# Patient Record
Sex: Female | Born: 1960
Health system: Southern US, Community
[De-identification: ages and names within clinical notes are randomized; demographics above are authoritative.]

## PROBLEM LIST (undated history)

## (undated) DIAGNOSIS — K219 Gastro-esophageal reflux disease without esophagitis: Secondary | ICD-10-CM

## (undated) DIAGNOSIS — M25511 Pain in right shoulder: Secondary | ICD-10-CM

## (undated) DIAGNOSIS — E785 Hyperlipidemia, unspecified: Secondary | ICD-10-CM

## (undated) DIAGNOSIS — I10 Essential (primary) hypertension: Secondary | ICD-10-CM

## (undated) DIAGNOSIS — F329 Major depressive disorder, single episode, unspecified: Secondary | ICD-10-CM

## (undated) DIAGNOSIS — I2510 Atherosclerotic heart disease of native coronary artery without angina pectoris: Secondary | ICD-10-CM

## (undated) DIAGNOSIS — N3000 Acute cystitis without hematuria: Secondary | ICD-10-CM

## (undated) DIAGNOSIS — G8929 Other chronic pain: Secondary | ICD-10-CM

## (undated) DIAGNOSIS — F32A Depression, unspecified: Secondary | ICD-10-CM

## (undated) DIAGNOSIS — R918 Other nonspecific abnormal finding of lung field: Secondary | ICD-10-CM

## (undated) HISTORY — DX: Other chronic pain: G89.29

## (undated) HISTORY — DX: Acute cystitis without hematuria: N30.00

## (undated) HISTORY — DX: Pain in right shoulder: M25.511

## (undated) HISTORY — DX: Hyperlipidemia, unspecified: E78.5

## (undated) HISTORY — DX: Atherosclerotic heart disease of native coronary artery without angina pectoris: I25.10

## (undated) HISTORY — PX: GASTRIC FUNDOPLICATION: SHX226

## (undated) HISTORY — DX: Other nonspecific abnormal finding of lung field: R91.8

## (undated) HISTORY — DX: Gastro-esophageal reflux disease without esophagitis: K21.9

---

## 2010-06-22 ENCOUNTER — Emergency Department: Payer: Self-pay | Admitting: Emergency Medicine

## 2010-06-24 ENCOUNTER — Ambulatory Visit: Payer: Self-pay | Admitting: Surgery

## 2010-06-25 ENCOUNTER — Emergency Department: Payer: Self-pay | Admitting: Emergency Medicine

## 2010-08-26 ENCOUNTER — Emergency Department: Payer: Self-pay | Admitting: Emergency Medicine

## 2010-09-27 ENCOUNTER — Ambulatory Visit: Payer: Self-pay | Admitting: Family Medicine

## 2010-10-30 HISTORY — PX: TOTAL VAGINAL HYSTERECTOMY: SHX2548

## 2011-03-12 ENCOUNTER — Inpatient Hospital Stay: Payer: Self-pay | Admitting: Internal Medicine

## 2011-07-05 ENCOUNTER — Emergency Department: Payer: Self-pay | Admitting: Unknown Physician Specialty

## 2011-08-31 DIAGNOSIS — R918 Other nonspecific abnormal finding of lung field: Secondary | ICD-10-CM | POA: Insufficient documentation

## 2012-02-07 ENCOUNTER — Ambulatory Visit: Payer: Self-pay

## 2012-05-24 DIAGNOSIS — K219 Gastro-esophageal reflux disease without esophagitis: Secondary | ICD-10-CM | POA: Insufficient documentation

## 2012-05-31 DIAGNOSIS — L52 Erythema nodosum: Secondary | ICD-10-CM | POA: Insufficient documentation

## 2012-10-30 LAB — HM MAMMOGRAPHY: HM MAMMO: NORMAL

## 2013-10-30 HISTORY — PX: COLONOSCOPY: SHX5424

## 2013-10-30 HISTORY — PX: COLONOSCOPY WITH ESOPHAGOGASTRODUODENOSCOPY (EGD): SHX5779

## 2013-10-30 HISTORY — PX: HIATAL HERNIA REPAIR: SHX195

## 2013-10-30 LAB — HM COLONOSCOPY: HM Colonoscopy: NORMAL

## 2014-12-19 ENCOUNTER — Ambulatory Visit: Payer: Self-pay | Admitting: Emergency Medicine

## 2015-02-02 ENCOUNTER — Ambulatory Visit
Admit: 2015-02-02 | Disposition: A | Discharge status: Home / Self Care | Payer: Self-pay | Attending: Family Medicine | Admitting: Family Medicine

## 2015-02-04 ENCOUNTER — Ambulatory Visit
Admit: 2015-02-04 | Disposition: A | Discharge status: Home / Self Care | Payer: Self-pay | Attending: Unknown Physician Specialty | Admitting: Unknown Physician Specialty

## 2015-02-17 ENCOUNTER — Encounter: Payer: Self-pay | Admitting: Internal Medicine

## 2015-02-17 DIAGNOSIS — N951 Menopausal and female climacteric states: Secondary | ICD-10-CM | POA: Insufficient documentation

## 2015-02-17 DIAGNOSIS — I10 Essential (primary) hypertension: Secondary | ICD-10-CM | POA: Insufficient documentation

## 2015-02-17 DIAGNOSIS — F17201 Nicotine dependence, unspecified, in remission: Secondary | ICD-10-CM | POA: Insufficient documentation

## 2015-02-17 DIAGNOSIS — Z9889 Other specified postprocedural states: Secondary | ICD-10-CM | POA: Insufficient documentation

## 2015-02-17 DIAGNOSIS — M542 Cervicalgia: Secondary | ICD-10-CM | POA: Insufficient documentation

## 2015-02-17 DIAGNOSIS — E782 Mixed hyperlipidemia: Secondary | ICD-10-CM | POA: Insufficient documentation

## 2015-02-17 DIAGNOSIS — E781 Pure hyperglyceridemia: Secondary | ICD-10-CM | POA: Insufficient documentation

## 2015-02-25 ENCOUNTER — Other Ambulatory Visit: Payer: Self-pay

## 2015-02-25 DIAGNOSIS — Z1231 Encounter for screening mammogram for malignant neoplasm of breast: Secondary | ICD-10-CM

## 2015-03-17 ENCOUNTER — Ambulatory Visit
Admission: RE | Admit: 2015-03-17 | Discharge: 2015-03-17 | Disposition: A | Discharge status: Home / Self Care | Payer: Managed Care, Other (non HMO) | Source: Ambulatory Visit | Attending: Internal Medicine | Admitting: Internal Medicine

## 2015-03-17 DIAGNOSIS — Z1231 Encounter for screening mammogram for malignant neoplasm of breast: Secondary | ICD-10-CM | POA: Diagnosis not present

## 2015-04-09 ENCOUNTER — Ambulatory Visit
Admission: EM | Admit: 2015-04-09 | Discharge: 2015-04-09 | Disposition: A | Discharge status: Home / Self Care | Payer: Managed Care, Other (non HMO) | Attending: Family Medicine | Admitting: Family Medicine

## 2015-04-09 DIAGNOSIS — R109 Unspecified abdominal pain: Secondary | ICD-10-CM | POA: Diagnosis present

## 2015-04-09 DIAGNOSIS — G8929 Other chronic pain: Secondary | ICD-10-CM

## 2015-04-09 DIAGNOSIS — I10 Essential (primary) hypertension: Secondary | ICD-10-CM | POA: Insufficient documentation

## 2015-04-09 DIAGNOSIS — N390 Urinary tract infection, site not specified: Secondary | ICD-10-CM | POA: Insufficient documentation

## 2015-04-09 DIAGNOSIS — R1011 Right upper quadrant pain: Secondary | ICD-10-CM | POA: Diagnosis not present

## 2015-04-09 DIAGNOSIS — R1010 Upper abdominal pain, unspecified: Secondary | ICD-10-CM | POA: Diagnosis not present

## 2015-04-09 HISTORY — DX: Major depressive disorder, single episode, unspecified: F32.9

## 2015-04-09 HISTORY — DX: Depression, unspecified: F32.A

## 2015-04-09 HISTORY — DX: Essential (primary) hypertension: I10

## 2015-04-09 LAB — CBC WITH DIFFERENTIAL/PLATELET
Basophils Absolute: 0.1 10*3/uL (ref 0–0.1)
Basophils Relative: 1 %
Eosinophils Absolute: 0.1 10*3/uL (ref 0–0.7)
Eosinophils Relative: 1 %
HCT: 39.1 % (ref 35.0–47.0)
Hemoglobin: 13.5 g/dL (ref 12.0–16.0)
Lymphocytes Relative: 19 %
Lymphs Abs: 1.9 10*3/uL (ref 1.0–3.6)
MCH: 33.4 pg (ref 26.0–34.0)
MCHC: 34.6 g/dL (ref 32.0–36.0)
MCV: 96.5 fL (ref 80.0–100.0)
Monocytes Absolute: 0.9 10*3/uL (ref 0.2–0.9)
Monocytes Relative: 9 %
Neutro Abs: 7.1 10*3/uL — ABNORMAL HIGH (ref 1.4–6.5)
Neutrophils Relative %: 70 %
Platelets: 214 10*3/uL (ref 150–440)
RBC: 4.05 MIL/uL (ref 3.80–5.20)
RDW: 12.7 % (ref 11.5–14.5)
WBC: 10.2 10*3/uL (ref 3.6–11.0)

## 2015-04-09 LAB — URINALYSIS COMPLETE WITH MICROSCOPIC (ARMC ONLY)
Bilirubin Urine: NEGATIVE
Glucose, UA: NEGATIVE mg/dL
Ketones, ur: NEGATIVE mg/dL
NITRITE: NEGATIVE
Protein, ur: NEGATIVE mg/dL
SPECIFIC GRAVITY, URINE: 1.025 (ref 1.005–1.030)
pH: 5 (ref 5.0–8.0)

## 2015-04-09 LAB — COMPREHENSIVE METABOLIC PANEL
ALT: 38 U/L (ref 14–54)
AST: 29 U/L (ref 15–41)
Albumin: 4.3 g/dL (ref 3.5–5.0)
Alkaline Phosphatase: 101 U/L (ref 38–126)
Anion gap: 7 (ref 5–15)
BUN: 20 mg/dL (ref 6–20)
CO2: 31 mmol/L (ref 22–32)
Calcium: 9.6 mg/dL (ref 8.9–10.3)
Chloride: 103 mmol/L (ref 101–111)
Creatinine, Ser: 0.82 mg/dL (ref 0.44–1.00)
GFR calc Af Amer: >60 mL/min (ref >60)
GFR calc non Af Amer: >60 mL/min (ref >60)
Glucose, Bld: 116 mg/dL — ABNORMAL HIGH (ref 65–99)
Potassium: 3.6 mmol/L (ref 3.5–5.1)
Sodium: 141 mmol/L (ref 135–145)
Total Bilirubin: 0.5 mg/dL (ref 0.3–1.2)
Total Protein: 7.6 g/dL (ref 6.5–8.1)

## 2015-04-09 LAB — LIPASE, BLOOD: Lipase: 46 U/L (ref 22–51)

## 2015-04-09 LAB — AMYLASE: Amylase: 72 U/L (ref 28–100)

## 2015-04-09 MED ORDER — RANITIDINE HCL 150 MG PO CAPS: 150.0000 mg | ORAL_CAPSULE | Freq: Every day | ORAL | Status: DC

## 2015-04-09 MED ORDER — SULFAMETHOXAZOLE-TRIMETHOPRIM 800-160 MG PO TABS: 1.0000 | ORAL_TABLET | Freq: Two times a day (BID) | ORAL | Status: DC

## 2015-04-09 MED ORDER — PHENAZOPYRIDINE HCL 200 MG PO TABS: 200.0000 mg | ORAL_TABLET | Freq: Three times a day (TID) | ORAL | Status: DC

## 2015-04-09 NOTE — ED Provider Notes (Signed)
CSN: [id number]     Arrival date & time 04/09/15  4098 History   None    Chief Complaint  Patient presents with  . Flank Pain   (Consider location/radiation/quality/duration/timing/severity/associated sxs/prior Treatment) Patient is a 54 y.o. female presenting with abdominal pain. The history is provided by the patient. No language interpreter was used.  Abdominal Pain Pain location:  RUQ Pain quality: aching and cramping   Pain radiates to:  Back Pain severity:  Moderate (varies) Onset quality:  Sudden Timing:  Intermittent Progression:  Waxing and waning Chronicity:  Recurrent Context comment:  Pt c/o RUQ pain radiating to right back x 4 years, states it happens right afte eating. Previous surgery and endoscopy For GI issues in [place]. Has PCP appt scheduled for next week. Denies ETOH or smoking. Requesting referral to local GI Relieved by:  Nothing Worsened by:  Eating Ineffective treatments:  Antacids Associated symptoms: nausea   Associated symptoms: no chest pain, no chills, no constipation, no diarrhea, no dysuria, no fatigue, no fever, no hematemesis, no hematochezia, no hematuria, no melena, no shortness of breath, no vaginal bleeding, no vaginal discharge and no vomiting   Risk factors: multiple surgeries     Past Medical History  Diagnosis Date  . Hypertension   . Depression    Past Surgical History  Procedure Laterality Date  . Hiatal hernia repair  2015  . Total vaginal hysterectomy  2012  . Abdominal hysterectomy     Family History  Problem Relation Age of Onset  . Diabetes Mother   . Heart disease Mother   . Diabetes Sister    History  Substance Use Topics  . Smoking status: Former Games developer  . Smokeless tobacco: Not on file  . Alcohol Use: No   OB History    No data available     Review of Systems  Constitutional: Positive for appetite change. Negative for fever, chills and fatigue.  HENT: Negative.   Respiratory: Negative for shortness of breath.    Cardiovascular: Negative for chest pain.  Gastrointestinal: Positive for nausea and abdominal pain. Negative for vomiting, diarrhea, constipation, melena, hematochezia and hematemesis.  Endocrine: Negative.   Genitourinary: Negative for dysuria, hematuria, vaginal bleeding and vaginal discharge.  Skin: Negative for rash.  Allergic/Immunologic: Negative.   Neurological: Negative.   Hematological: Negative.   Psychiatric/Behavioral: Negative.   All other systems reviewed and are negative.   Allergies  Review of patient's allergies indicates no known allergies.  Home Medications   Prior to Admission medications   Medication Sig Start Date End Date Taking? Authorizing Provider  aspirin 81 MG tablet Take 1 tablet by mouth daily.   Yes Historical Provider, MD  losartan (COZAAR) 50 MG tablet Take 1 tablet by mouth daily.   Yes Historical Provider, MD  metoprolol (LOPRESSOR) 50 MG tablet Take 1 tablet by mouth daily.   Yes Historical Provider, MD  PARoxetine (PAXIL) 20 MG tablet Take 1 tablet by mouth daily.   Yes Historical Provider, MD  phenazopyridine (PYRIDIUM) 200 MG tablet Take 1 tablet (200 mg total) by mouth 3 (three) times daily. 04/09/15   Clancy Gourd, NP  Probiotic CAPS Take by mouth.    Historical Provider, MD  ranitidine (ZANTAC) 150 MG capsule Take 1 capsule (150 mg total) by mouth daily. 04/09/15   Clancy Gourd, NP  simvastatin (ZOCOR) 20 MG tablet Take 40 mg by mouth daily.     Historical Provider, MD  sulfamethoxazole-trimethoprim (BACTRIM DS,SEPTRA DS) 800-160 MG per tablet Take 1  tablet by mouth 2 (two) times daily. 04/09/15   Rosa Gambale, NP   BP 149/88 mmHg  Pulse 68  Temp(Src) 97.4 F (36.3 C) (Tympanic)  Resp 16  Ht 5\' 3"  (1.6 m)  Wt 140 lb (63.504 kg)  BMI 24.81 kg/m2  SpO2 97% Physical Exam  Constitutional: She is oriented to person, place, and time. She appears well-developed and well-nourished. She is active and cooperative.  Non-toxic  appearance. She does not have a sickly appearance. She does not appear ill. No distress.  HENT:  Head: Normocephalic.  Right Ear: Tympanic membrane normal.  Left Ear: Tympanic membrane normal.  Nose: Nose normal.  Mouth/Throat: Uvula is midline, oropharynx is clear and moist and mucous membranes are normal.  Neck: Trachea normal.  Cardiovascular: Normal rate, regular rhythm, normal heart sounds and normal pulses.   Pulmonary/Chest: Effort normal and breath sounds normal.  Abdominal: Soft. Normal appearance and bowel sounds are normal. She exhibits no distension. There is tenderness in the right upper quadrant. There is no rebound and no guarding.  Neurological: She is alert and oriented to person, place, and time. No cranial nerve deficit or sensory deficit. GCS eye subscore is 4. GCS verbal subscore is 5. GCS motor subscore is 6.  Psychiatric: She has a normal mood and affect. Her speech is normal.  Nursing note and vitals reviewed.   ED Course  Procedures (including critical care time) Labs Review Labs Reviewed  URINALYSIS COMPLETEWITH MICROSCOPIC ([facility] ONLY) - Abnormal; Notable for the following:    Hgb urine dipstick TRACE (*)    Leukocytes, UA 1+ (*)    Bacteria, UA MANY (*)    Squamous Epithelial / LPF 6-30 (*)    All other components within normal limits  CBC WITH DIFFERENTIAL/PLATELET - Abnormal; Notable for the following:    Neutro Abs 7.1 (*)    All other components within normal limits  COMPREHENSIVE METABOLIC PANEL - Abnormal; Notable for the following:    Glucose, Bld 116 (*)    All other components within normal limits  URINE CULTURE  AMYLASE  LIPASE, BLOOD   Results for orders placed or performed during the hospital encounter of 04/09/15  Urinalysis complete, with microscopic  Result Value Ref Range   Color, Urine YELLOW YELLOW   APPearance CLEAR CLEAR   Glucose, UA NEGATIVE NEGATIVE mg/dL   Bilirubin Urine NEGATIVE NEGATIVE   Ketones, ur NEGATIVE NEGATIVE  mg/dL   Specific Gravity, Urine 1.025 1.005 - 1.030   Hgb urine dipstick TRACE (A) NEGATIVE   pH 5.0 5.0 - 8.0   Protein, ur NEGATIVE NEGATIVE mg/dL   Nitrite NEGATIVE NEGATIVE   Leukocytes, UA 1+ (A) NEGATIVE   RBC / HPF 0-5 <3 RBC/hpf   WBC, UA 6-30 <3 WBC/hpf   Bacteria, UA MANY (A) RARE   Squamous Epithelial / LPF 6-30 (A) RARE  CBC with Differential  Result Value Ref Range   WBC 10.2 3.6 - 11.0 K/uL   RBC 4.05 3.80 - 5.20 MIL/uL   Hemoglobin 13.5 12.0 - 16.0 g/dL   HCT 27.0 78.6 - 75.4 %   MCV 96.5 80.0 - 100.0 fL   MCH 33.4 26.0 - 34.0 pg   MCHC 34.6 32.0 - 36.0 g/dL   RDW 49.2 01.0 - 07.1 %   Platelets 214 150 - 440 K/uL   Neutrophils Relative % 70 %   Neutro Abs 7.1 (H) 1.4 - 6.5 K/uL   Lymphocytes Relative 19 %   Lymphs Abs 1.9 1.0 -  3.6 K/uL   Monocytes Relative 9 %   Monocytes Absolute 0.9 0.2 - 0.9 K/uL   Eosinophils Relative 1 %   Eosinophils Absolute 0.1 0 - 0.7 K/uL   Basophils Relative 1 %   Basophils Absolute 0.1 0 - 0.1 K/uL  Comprehensive metabolic panel  Result Value Ref Range   Sodium 141 135 - 145 mmol/L   Potassium 3.6 3.5 - 5.1 mmol/L   Chloride 103 101 - 111 mmol/L   CO2 31 22 - 32 mmol/L   Glucose, Bld 116 (H) 65 - 99 mg/dL   BUN 20 6 - 20 mg/dL   Creatinine, Ser 1.61 0.44 - 1.00 mg/dL   Calcium 9.6 8.9 - 09.6 mg/dL   Total Protein 7.6 6.5 - 8.1 g/dL   Albumin 4.3 3.5 - 5.0 g/dL   AST 29 15 - 41 U/L   ALT 38 14 - 54 U/L   Alkaline Phosphatase 101 38 - 126 U/L   Total Bilirubin 0.5 0.3 - 1.2 mg/dL   GFR calc non Af Amer >60 >60 mL/min   GFR calc Af Amer >60 >60 mL/min   Anion gap 7 5 - 15  Amylase  Result Value Ref Range   Amylase 72 28 - 100 U/L  Lipase, blood  Result Value Ref Range   Lipase 46 22 - 51 U/L   Imaging Review No results found.   MDM   1. Chronic RUQ pain   2. UTI (lower urinary tract infection)   3. Essential hypertension     0920: Labs pending.   Discusssed results and plan of care with pt. Referred pt  back to PCP, Dr. Asencion Partridge for general medical issues. Referred back to GI in [facility] or local GI, Dr. Bluford Kaufmann, for further evaluation. Treating with bactrim for UTI and Zantac for abdominal pain/stomach issues. Pt verbalized understanding to this provider.   Clancy Gourd, NP 04/09/15 1229

## 2015-04-09 NOTE — Discharge Instructions (Signed)
Abdominal Pain Many things can cause belly (abdominal) pain. Most times, the belly pain is not dangerous. Many cases of belly pain can be watched and treated at home. HOME CARE   Do not take medicines that help you go poop (laxatives) unless told to by your doctor.  Only take medicine as told by your doctor.  Eat or drink as told by your doctor. Your doctor will tell you if you should be on a special diet. GET HELP IF:  You do not know what is causing your belly pain.  You have belly pain while you are sick to your stomach (nauseous) or have runny poop (diarrhea).  You have pain while you pee or poop.  Your belly pain wakes you up at night.  You have belly pain that gets worse or better when you eat.  You have belly pain that gets worse when you eat fatty foods.  You have a fever. GET HELP RIGHT AWAY IF:   The pain does not go away within 2 hours.  You keep throwing up (vomiting).  The pain changes and is only in the right or left part of the belly.  You have bloody or tarry looking poop. MAKE SURE YOU:   Understand these instructions.  Will watch your condition.  Will get help right away if you are not doing well or get worse. Document Released: 04/03/2008 Document Revised: 10/21/2013 Document Reviewed: 06/25/2013 [facility] Patient Information 2015 [place], [place]. This information is not intended to replace advice given to you by your health care provider. Make sure you discuss any questions you have with your health care provider.  Please avoid spicy,greasy fried foods as they will aggravate your symptoms,avoid alcohol and caffeine as well. Follow up with your PCP Dr. Asencion Partridge as scheduled. Folllow up with Dr. Bluford Kaufmann, GI, for further evalaution of GI symptoms or f/u with your provider in [place]. Take meds as directed(Zantac, Bactrim), drink plenty of water. Go to ER for urgent, emergent, or worsening issues(CP, SOB,etc). Return to [facility] as needed.

## 2015-04-09 NOTE — ED Notes (Signed)
Pt states "I have right side and back pain, especially after I eat. I have watery stools after I eat. This has been going for years."

## 2015-04-11 LAB — URINE CULTURE: Culture: >=100000

## 2015-04-15 ENCOUNTER — Telehealth: Payer: Self-pay | Admitting: Surgery

## 2015-04-15 NOTE — Telephone Encounter (Signed)
LVM for pt to call and sched. appt for pain/swelling under arm pit

## 2015-04-18 ENCOUNTER — Encounter: Payer: Self-pay | Admitting: Internal Medicine

## 2015-04-18 ENCOUNTER — Other Ambulatory Visit: Payer: Self-pay | Admitting: Internal Medicine

## 2015-04-19 ENCOUNTER — Encounter: Payer: Self-pay | Admitting: Internal Medicine

## 2015-04-19 ENCOUNTER — Other Ambulatory Visit: Payer: Self-pay

## 2015-04-19 ENCOUNTER — Ambulatory Visit (INDEPENDENT_AMBULATORY_CARE_PROVIDER_SITE_OTHER): Payer: Managed Care, Other (non HMO) | Admitting: Internal Medicine

## 2015-04-19 VITALS — BP 132/82 | HR 60 | Ht 62.5 in | Wt 134.8 lb

## 2015-04-19 DIAGNOSIS — I10 Essential (primary) hypertension: Secondary | ICD-10-CM

## 2015-04-19 DIAGNOSIS — N3001 Acute cystitis with hematuria: Secondary | ICD-10-CM | POA: Diagnosis not present

## 2015-04-19 DIAGNOSIS — G8929 Other chronic pain: Secondary | ICD-10-CM | POA: Insufficient documentation

## 2015-04-19 DIAGNOSIS — M25511 Pain in right shoulder: Secondary | ICD-10-CM | POA: Diagnosis not present

## 2015-04-19 DIAGNOSIS — Z0000 Encounter for general adult medical examination without abnormal findings: Secondary | ICD-10-CM | POA: Diagnosis not present

## 2015-04-19 DIAGNOSIS — K219 Gastro-esophageal reflux disease without esophagitis: Secondary | ICD-10-CM

## 2015-04-19 DIAGNOSIS — E785 Hyperlipidemia, unspecified: Secondary | ICD-10-CM

## 2015-04-19 LAB — POCT URINALYSIS DIPSTICK
Bilirubin, UA: NEGATIVE
Glucose, UA: NEGATIVE
KETONES UA: NEGATIVE
NITRITE UA: POSITIVE
Spec Grav, UA: >=1.03
Urobilinogen, UA: 0.2
pH, UA: 5

## 2015-04-19 MED ORDER — SIMVASTATIN 40 MG PO TABS: 40.0000 mg | ORAL_TABLET | Freq: Every day | ORAL | Status: DC

## 2015-04-19 MED ORDER — PAROXETINE HCL 20 MG PO TABS: 20.0000 mg | ORAL_TABLET | Freq: Every day | ORAL | Status: DC

## 2015-04-19 MED ORDER — DICYCLOMINE HCL 10 MG PO CAPS: 10.0000 mg | ORAL_CAPSULE | Freq: Three times a day (TID) | ORAL | Status: DC

## 2015-04-19 MED ORDER — OMEPRAZOLE 20 MG PO CPDR: 20.0000 mg | DELAYED_RELEASE_CAPSULE | Freq: Every day | ORAL | Status: DC

## 2015-04-19 MED ORDER — METOPROLOL TARTRATE 50 MG PO TABS: 50.0000 mg | ORAL_TABLET | Freq: Two times a day (BID) | ORAL | Status: DC

## 2015-04-19 MED ORDER — CIPROFLOXACIN HCL 250 MG PO TABS: 250.0000 mg | ORAL_TABLET | Freq: Two times a day (BID) | ORAL | Status: DC

## 2015-04-19 MED ORDER — LOSARTAN POTASSIUM 50 MG PO TABS: 50.0000 mg | ORAL_TABLET | Freq: Every day | ORAL | Status: DC

## 2015-04-19 NOTE — Progress Notes (Signed)
Date:  04/19/2015   Name:  Zillah Alexie   DOB:  [birth year]   MRN:  [id number]   Chief Complaint: Annual Exam Darlene Newman is a 54 y.o. female who presents today for her Complete Annual Exam. She feels fairly well. She reports exercising none.  She is dealing with shoulder and neck pain. She reports she is sleeping well. She has no breast problems.  Recent mammogram was normal.  CLINICAL DATA: Screening.  EXAM: DIGITAL SCREENING BILATERAL MAMMOGRAM WITH CAD  COMPARISON: Previous exam(s).  ACR Breast Density Category b: There are scattered areas of fibroglandular density.  FINDINGS: There are no findings suspicious for malignancy. Images were processed with CAD.  IMPRESSION: No mammographic evidence of malignancy. A result letter of this screening mammogram will be mailed directly to the patient.  RECOMMENDATION: Screening mammogram in one year. (Code:[id number])  BI-RADS CATEGORY 1: Negative.   Electronically Signed  By: Sherian Rein M.D.  On: 03/17/2015 10:09  Hypertension Associated symptoms include neck pain. Pertinent negatives include no chest pain, headaches, palpitations or shortness of breath. Past treatments include angiotensin blockers and beta blockers. The current treatment provides moderate improvement. There are no compliance problems.  There is no history of kidney disease or CAD/MI. There is no history of chronic renal disease.  Hyperlipidemia This is a chronic problem. The problem is controlled. Recent lipid tests were reviewed and are normal. She has no history of chronic renal disease. Pertinent negatives include no chest pain or shortness of breath. Current antihyperlipidemic treatment includes statins. The current treatment provides significant improvement of lipids. There are no compliance problems.   Abdominal Pain This is a recurrent problem. The current episode started more than 1 year ago. The problem occurs intermittently (about 2  weeks out of every 8 weeks has symptoms). The most recent episode lasted 2 weeks. The problem has been waxing and waning. The pain is mild. The quality of the pain is colicky and cramping. Associated symptoms include arthralgias, diarrhea and flatus. Pertinent negatives include no dysuria, headaches, hematuria, vomiting or weight loss. The pain is aggravated by eating. The pain is relieved by bowel movements. Prior diagnostic workup includes GI consult, lower endoscopy, upper endoscopy and ultrasound (symptoms have been attributed to gall bladder disease but she states the she has had several ultrasound and a HIDDA scan - all negative).     Review of Systems:  Review of Systems  Constitutional: Negative for weight loss and unexpected weight change.  HENT: Negative for hearing loss.   Eyes: Negative for visual disturbance.  Respiratory: Positive for cough. Negative for shortness of breath and wheezing.   Cardiovascular: Negative for chest pain, palpitations and leg swelling.  Gastrointestinal: Positive for diarrhea, abdominal distention and flatus. Negative for vomiting and abdominal pain.  Genitourinary: Negative for dysuria, urgency and hematuria.  Musculoskeletal: Positive for back pain, arthralgias and neck pain.  Skin: Negative for color change and rash.  Neurological: Negative for dizziness and headaches.  Hematological: Negative for adenopathy.  Psychiatric/Behavioral: Negative for sleep disturbance and dysphoric mood.    Patient Active Problem List   Diagnosis Date Noted  . HLD (hyperlipidemia) 02/17/2015  . Benign hypertension 02/17/2015  . Climacteric 02/17/2015  . Cervical pain 02/17/2015  . History of fundoplication 02/17/2015  . Current tobacco use 02/17/2015  . Calcification of coronary artery 12/25/2012  . Dermatitis contusiformis 05/31/2012  . Acid reflux 05/24/2012  . Lung mass 08/31/2011    Prior to Admission medications   Medication Sig Start Date  End Date  Taking? Authorizing Provider  losartan (COZAAR) 50 MG tablet Take 1 tablet by mouth daily.   Yes Historical Provider, MD  metoprolol (LOPRESSOR) 50 MG tablet Take 1 tablet by mouth daily.   Yes Historical Provider, MD  PARoxetine (PAXIL) 20 MG tablet Take 1 tablet by mouth daily.   Yes Historical Provider, MD  aspirin 81 MG tablet Take 1 tablet by mouth daily.    Historical Provider, MD  meloxicam (MOBIC) 15 MG tablet Take 1 tablet by mouth daily. 01/29/15   Historical Provider, MD  phenazopyridine (PYRIDIUM) 200 MG tablet Take 1 tablet (200 mg total) by mouth 3 (three) times daily. Patient not taking: Reported on 04/19/2015 04/09/15   Clancy Gourd, NP  Probiotic CAPS Take by mouth.    Historical Provider, MD  PROVENTIL HFA 108 (90 BASE) MCG/ACT inhaler Inhale 2 puffs into the lungs 4 (four) times daily as needed. 02/02/15   Historical Provider, MD  ranitidine (ZANTAC) 150 MG capsule Take 1 capsule (150 mg total) by mouth daily. Patient not taking: Reported on 04/19/2015 04/09/15   Clancy Gourd, NP  simvastatin (ZOCOR) 40 MG tablet Take 1 tablet by mouth daily. 03/17/15   Historical Provider, MD  sulfamethoxazole-trimethoprim (BACTRIM DS,SEPTRA DS) 800-160 MG per tablet Take 1 tablet by mouth 2 (two) times daily. Patient not taking: Reported on 04/19/2015 04/09/15   Clancy Gourd, NP  tiZANidine (ZANAFLEX) 4 MG tablet  01/12/15   Historical Provider, MD    No Known Allergies  Past Surgical History  Procedure Laterality Date  . Hiatal hernia repair  2015  . Total vaginal hysterectomy  2012  . Abdominal hysterectomy      History  Substance Use Topics  . Smoking status: Former Smoker    Quit date: 10/30/2006  . Smokeless tobacco: Not on file  . Alcohol Use: No     Medication list has been reviewed and updated.  Physical Examination:  Physical Exam  Constitutional: She is oriented to person, place, and time. She appears well-developed and well-nourished. No distress.  HENT:   Head: Normocephalic and atraumatic.  Right Ear: External ear and ear canal normal. No swelling.  Left Ear: External ear and ear canal normal. No swelling.  Nose: Right sinus exhibits no maxillary sinus tenderness. Left sinus exhibits no maxillary sinus tenderness.  Mouth/Throat: Uvula is midline and oropharynx is clear and moist. No oropharyngeal exudate or posterior oropharyngeal edema.  Eyes: Conjunctivae are normal. Right eye exhibits no discharge. Left eye exhibits no discharge. No scleral icterus.  Neck: Normal range of motion. Neck supple. No tracheal tenderness present. Carotid bruit is not present. No thyromegaly present.  Cardiovascular: Normal rate, regular rhythm and normal heart sounds.   Pulmonary/Chest: Effort normal and breath sounds normal. No respiratory distress.  Abdominal: Soft. Normal appearance and bowel sounds are normal. There is no hepatosplenomegaly. There is generalized tenderness (mild tenderness). There is no rebound.  Genitourinary: No breast swelling, tenderness, discharge or bleeding.  Musculoskeletal: Normal range of motion.  Lymphadenopathy:    She has no cervical adenopathy.    She has no axillary adenopathy.       Right axillary: Pectoral adenopathy: mild fullness noted - likely soft tissue swelling related to right shoulder.  Neurological: She is alert and oriented to person, place, and time. She has normal reflexes.  Skin: Skin is warm, dry and intact. No rash noted.  Psychiatric: She has a normal mood and affect. Her speech is normal and behavior is normal. Thought content  normal. Cognition and memory are normal.    BP 132/82 mmHg  Pulse 60  Ht 5' 2.5" (1.588 m)  Wt 134 lb 12.8 oz (61.145 kg)  BMI 24.25 kg/m2  Assessment and Plan: 1. Annual physical exam Continue paxil for menopausal symptoms - POCT urinalysis dipstick - PARoxetine (PAXIL) 20 MG tablet; Take 1 tablet (20 mg total) by mouth daily.  Dispense: 30 tablet; Refill: 12  2. Benign  hypertension Controlled  - losartan (COZAAR) 50 MG tablet; Take 1 tablet (50 mg total) by mouth daily.  Dispense: 30 tablet; Refill: 12 - metoprolol (LOPRESSOR) 50 MG tablet; Take 1 tablet (50 mg total) by mouth 2 (two) times daily.  Dispense: 60 tablet; Refill: 12 - Comprehensive metabolic panel - TSH - CBC with Differential/Platelet  3. HLD (hyperlipidemia) On statin therapy - simvastatin (ZOCOR) 40 MG tablet; Take 1 tablet (40 mg total) by mouth daily.  Dispense: 30 tablet; Refill: 12 - Lipid panel  4. Gastroesophageal reflux disease without esophagitis Will try bentyl before meals - dicyclomine (BENTYL) 10 MG capsule; Take 1 capsule (10 mg total) by mouth 4 (four) times daily -  before meals and at bedtime.  Dispense: 90 capsule; Refill: 3 - omeprazole (PRILOSEC) 20 MG capsule; Take 1 capsule (20 mg total) by mouth daily.  Dispense: 30 capsule; Refill: 5 - CBC with Differential/Platelet  5. Acute cystitis with hematuria Pt encouraged to take antibiotics as prescribed - POC urinalysis w microscopic (non auto) - ciprofloxacin (CIPRO) 250 MG tablet; Take 1 tablet (250 mg total) by mouth 2 (two) times daily.  Dispense: 14 tablet; Refill: 0  6. Shoulder pain Pt is begin referred to General surgery for axillary mass Physical therapy recommended for scapular pain (pt will probably not go due to cost) Now restricted to light duty at work  Bari Edward, MD [facility]  04/19/2015

## 2015-04-19 NOTE — Patient Instructions (Signed)

## 2015-04-20 LAB — CBC WITH DIFFERENTIAL/PLATELET
Basophils Absolute: 0 10*3/uL (ref 0.0–0.2)
Basos: 0 %
EOS (ABSOLUTE): 0.1 10*3/uL (ref 0.0–0.4)
Eos: 2 %
HEMOGLOBIN: 13.6 g/dL (ref 11.1–15.9)
Hematocrit: 40.3 % (ref 34.0–46.6)
IMMATURE GRANS (ABS): 0 10*3/uL (ref 0.0–0.1)
IMMATURE GRANULOCYTES: 0 %
LYMPHS: 18 %
Lymphocytes Absolute: 1.5 10*3/uL (ref 0.7–3.1)
MCH: 33 pg (ref 26.6–33.0)
MCHC: 33.7 g/dL (ref 31.5–35.7)
MCV: 98 fL — ABNORMAL HIGH (ref 79–97)
Monocytes Absolute: 0.6 10*3/uL (ref 0.1–0.9)
Monocytes: 7 %
NEUTROS PCT: 73 %
Neutrophils Absolute: 6.2 10*3/uL (ref 1.4–7.0)
Platelets: 229 10*3/uL (ref 150–379)
RBC: 4.12 x10E6/uL (ref 3.77–5.28)
RDW: 13.3 % (ref 12.3–15.4)
WBC: 8.4 10*3/uL (ref 3.4–10.8)

## 2015-04-20 LAB — COMPREHENSIVE METABOLIC PANEL
ALBUMIN: 4.3 g/dL (ref 3.5–5.5)
ALT: 30 IU/L (ref 0–32)
AST: 21 IU/L (ref 0–40)
Albumin/Globulin Ratio: 1.8 (ref 1.1–2.5)
Alkaline Phosphatase: 105 IU/L (ref 39–117)
BUN / CREAT RATIO: 26 — AB (ref 9–23)
BUN: 21 mg/dL (ref 6–24)
Bilirubin Total: 0.6 mg/dL (ref 0.0–1.2)
CHLORIDE: 100 mmol/L (ref 97–108)
CO2: 25 mmol/L (ref 18–29)
CREATININE: 0.8 mg/dL (ref 0.57–1.00)
Calcium: 9.4 mg/dL (ref 8.7–10.2)
GFR calc Af Amer: 97 mL/min/{1.73_m2} (ref >59)
GFR, EST NON AFRICAN AMERICAN: 84 mL/min/{1.73_m2} (ref >59)
GLOBULIN, TOTAL: 2.4 g/dL (ref 1.5–4.5)
Glucose: 81 mg/dL (ref 65–99)
Potassium: 4 mmol/L (ref 3.5–5.2)
Sodium: 144 mmol/L (ref 134–144)
TOTAL PROTEIN: 6.7 g/dL (ref 6.0–8.5)

## 2015-04-20 LAB — LIPID PANEL
Chol/HDL Ratio: 4 ratio units (ref 0.0–4.4)
Cholesterol, Total: 166 mg/dL (ref 100–199)
HDL: 41 mg/dL (ref >39)
LDL Calculated: 83 mg/dL (ref 0–99)
TRIGLYCERIDES: 210 mg/dL — AB (ref 0–149)
VLDL Cholesterol Cal: 42 mg/dL — ABNORMAL HIGH (ref 5–40)

## 2015-04-20 LAB — TSH: TSH: 5.52 u[IU]/mL — AB (ref 0.450–4.500)

## 2015-04-21 LAB — T4: T4, Total: 7.8 ug/dL (ref 4.5–12.0)

## 2015-04-21 LAB — T3: T3 TOTAL: 94 ng/dL (ref 71–180)

## 2015-04-30 ENCOUNTER — Encounter: Payer: Self-pay | Admitting: Surgery

## 2015-04-30 ENCOUNTER — Ambulatory Visit (INDEPENDENT_AMBULATORY_CARE_PROVIDER_SITE_OTHER): Payer: Managed Care, Other (non HMO) | Admitting: Surgery

## 2015-04-30 VITALS — BP 132/81 | HR 56 | Temp 98.3°F | Ht 63.0 in | Wt 139.0 lb

## 2015-04-30 DIAGNOSIS — M79621 Pain in right upper arm: Secondary | ICD-10-CM

## 2015-04-30 DIAGNOSIS — M25511 Pain in right shoulder: Secondary | ICD-10-CM | POA: Diagnosis not present

## 2015-04-30 NOTE — Patient Instructions (Signed)
We will order an Ultrasound for your axillary area that is in question. You will be called by 05/05/15 by Central Scheduling. If you have not heard from them by 7/8, please call our office.  Our office will call you with results of this testing.  No need for follow-up unless something is seen on Ultrasound.  Call our office with any questions or concerns.

## 2015-04-30 NOTE — Progress Notes (Signed)
CC: Right axillary pain, ? Mass HPI: [name] who presents with ? Mass of her right axilla.  Says that she noticed it approx 2 months ago.  Has had axillary pain since injuring her shoulder 4 months ago.  Has been undergoing PT.  No other unusual nodules, no breast pain, swelling, redness, drainage. Normal mammogram.  No fevers/chills, night sweats, shortness of breath, cough, chest pain, nausea/vomiting, diarrhea/constipation, dysuria/hematuria.  Active Ambulatory Problems    Diagnosis Date Noted  . HLD (hyperlipidemia) 02/17/2015  . Benign hypertension 02/17/2015  . Climacteric 02/17/2015  . Cervical pain 02/17/2015  . History of fundoplication 02/17/2015  . Tobacco use disorder, mild, in sustained remission 02/17/2015  . Calcification of coronary artery 12/25/2012  . Dermatitis contusiformis 05/31/2012  . Acid reflux 05/24/2012  . Lung mass 08/31/2011  . Chronic right shoulder pain 04/19/2015   Resolved Ambulatory Problems    Diagnosis Date Noted  . No Resolved Ambulatory Problems   Past Medical History  Diagnosis Date  . Hypertension   . Depression   . Hyperlipidemia   . GERD (gastroesophageal reflux disease)   . Acute cystitis   . CAD (coronary artery disease)      Medication List       This list is accurate as of: 04/30/15 11:11 AM.  Always use your most recent med list.               dicyclomine 10 MG capsule  Commonly known as:  BENTYL  Take 1 capsule (10 mg total) by mouth 4 (four) times daily -  before meals and at bedtime.     losartan 50 MG tablet  Commonly known as:  COZAAR  Take 1 tablet (50 mg total) by mouth daily.     metoprolol 50 MG tablet  Commonly known as:  LOPRESSOR  Take 1 tablet (50 mg total) by mouth 2 (two) times daily.     omeprazole 20 MG capsule  Commonly known as:  PRILOSEC  Take 1 capsule (20 mg total) by mouth daily.     PARoxetine 20 MG tablet  Commonly known as:  PAXIL  Take 1 tablet (20 mg total) by mouth daily.     PROVENTIL HFA 108 (90 BASE) MCG/ACT inhaler  Generic drug:  albuterol  Inhale 2 puffs into the lungs 4 (four) times daily as needed.     simvastatin 40 MG tablet  Commonly known as:  ZOCOR  Take 1 tablet (40 mg total) by mouth daily.       Family History  Problem Relation Age of Onset  . Diabetes Mother   . Heart disease Mother   . Diabetes Sister    History   Social History  . Marital Status: Single    Spouse Name: N/A  . Number of Children: N/A  . Years of Education: N/A   Occupational History  . Not on file.   Social History Main Topics  . Smoking status: Former Smoker    Quit date: 10/30/2006  . Smokeless tobacco: Never Used  . Alcohol Use: No  . Drug Use: No  . Sexual Activity: Not on file   Other Topics Concern  . Not on file   Social History Narrative   No Known Allergies   ROS: Full ROS obtained, pertinent positives and negatives as above.  Blood pressure 132/81, pulse 56, temperature 98.3 F (36.8 C), temperature source Oral, height 5\' 3"  (1.6 m), weight 139 lb (63.05 kg). GEN: NAD/A&Ox3 FACE: no obvious facial  trauma, normal external nose, normal external ears EYES: no scleral icterus, no conjunctivitis HEAD: normocephalic atraumatic CV: RRR, no MRG AXILLA: No obvious masses, palpable lymphadenopathy bilaterally, relatively nontender bilaterally RESP: moving air well, lungs clear ABD: soft, nontender, nondistended EXT: moving all ext well, strength 5/5 NEURO: cnII-XII grossly intact, sensation intact all 4 ext  Imaging: Mammogram reviewed, no significant findings  A/P [name] with right axillary pain, ? Mass.  I feel no obvious mass or lymphadenopathy.  Suspect etiology of discomfort is musculoskeletal.  Have recommended 600 mg ibuprofen tid x 7 days.  Will obtain U/S right axilla as multiple sources feel that there is a mass and this causes patient significant anxiety.

## 2015-05-04 ENCOUNTER — Other Ambulatory Visit: Payer: Self-pay

## 2015-05-04 DIAGNOSIS — R2231 Localized swelling, mass and lump, right upper limb: Secondary | ICD-10-CM

## 2015-05-07 ENCOUNTER — Ambulatory Visit
Admission: RE | Admit: 2015-05-07 | Discharge: 2015-05-07 | Disposition: A | Discharge status: Home / Self Care | Payer: Managed Care, Other (non HMO) | Source: Ambulatory Visit | Attending: Surgery | Admitting: Surgery

## 2015-05-07 DIAGNOSIS — R2231 Localized swelling, mass and lump, right upper limb: Secondary | ICD-10-CM | POA: Diagnosis not present

## 2015-05-11 ENCOUNTER — Telehealth: Payer: Self-pay | Admitting: Surgery

## 2015-05-11 NOTE — Telephone Encounter (Signed)
Please call patient with results of ultrasound that was done on 05/07/2015

## 2015-05-12 NOTE — Telephone Encounter (Signed)
Returned call at this time. Reviewed results of US and explained that she needs to return to her PCP for management of pain symptoms.   Verbalizes understanding.

## 2015-05-13 LAB — SPECIMEN STATUS REPORT

## 2015-05-20 DIAGNOSIS — M7541 Impingement syndrome of right shoulder: Secondary | ICD-10-CM | POA: Insufficient documentation

## 2015-05-21 ENCOUNTER — Encounter: Payer: Self-pay | Admitting: Internal Medicine

## 2015-05-24 ENCOUNTER — Other Ambulatory Visit: Payer: Self-pay | Admitting: Unknown Physician Specialty

## 2015-05-24 DIAGNOSIS — G8929 Other chronic pain: Secondary | ICD-10-CM

## 2015-05-24 DIAGNOSIS — M7541 Impingement syndrome of right shoulder: Secondary | ICD-10-CM

## 2015-05-24 DIAGNOSIS — M25511 Pain in right shoulder: Principal | ICD-10-CM

## 2015-05-27 ENCOUNTER — Ambulatory Visit
Admission: RE | Admit: 2015-05-27 | Discharge: 2015-05-27 | Disposition: A | Discharge status: Home / Self Care | Payer: Managed Care, Other (non HMO) | Source: Ambulatory Visit | Attending: Unknown Physician Specialty | Admitting: Unknown Physician Specialty

## 2015-05-27 DIAGNOSIS — X58XXXA Exposure to other specified factors, initial encounter: Secondary | ICD-10-CM | POA: Diagnosis not present

## 2015-05-27 DIAGNOSIS — R609 Edema, unspecified: Secondary | ICD-10-CM | POA: Diagnosis not present

## 2015-05-27 DIAGNOSIS — M7541 Impingement syndrome of right shoulder: Secondary | ICD-10-CM | POA: Diagnosis present

## 2015-05-27 DIAGNOSIS — S46911A Strain of unspecified muscle, fascia and tendon at shoulder and upper arm level, right arm, initial encounter: Secondary | ICD-10-CM | POA: Diagnosis not present

## 2015-05-27 DIAGNOSIS — M25511 Pain in right shoulder: Secondary | ICD-10-CM | POA: Diagnosis present

## 2015-05-27 DIAGNOSIS — G8929 Other chronic pain: Secondary | ICD-10-CM

## 2015-06-17 ENCOUNTER — Other Ambulatory Visit: Payer: Self-pay | Admitting: Internal Medicine

## 2015-10-14 ENCOUNTER — Other Ambulatory Visit: Payer: Self-pay | Admitting: Internal Medicine

## 2015-11-10 ENCOUNTER — Other Ambulatory Visit: Payer: Self-pay | Admitting: Internal Medicine

## 2015-12-13 ENCOUNTER — Other Ambulatory Visit: Payer: Self-pay

## 2015-12-13 MED ORDER — DICYCLOMINE HCL 10 MG PO CAPS: 10.0000 mg | ORAL_CAPSULE | Freq: Three times a day (TID) | ORAL | Status: DC

## 2015-12-13 NOTE — Telephone Encounter (Signed)
Received fax from pharmacy.

## 2015-12-22 ENCOUNTER — Encounter: Payer: Self-pay | Admitting: Internal Medicine

## 2015-12-22 ENCOUNTER — Ambulatory Visit (INDEPENDENT_AMBULATORY_CARE_PROVIDER_SITE_OTHER): Payer: Managed Care, Other (non HMO) | Admitting: Internal Medicine

## 2015-12-22 VITALS — BP 116/78 | HR 87 | Temp 99.7°F | Ht 63.0 in | Wt 132.0 lb

## 2015-12-22 DIAGNOSIS — J111 Influenza due to unidentified influenza virus with other respiratory manifestations: Secondary | ICD-10-CM

## 2015-12-22 LAB — POCT INFLUENZA A/B
INFLUENZA A, POC: NEGATIVE
INFLUENZA B, POC: NEGATIVE

## 2015-12-22 MED ORDER — GUAIFENESIN-CODEINE 100-10 MG/5ML PO SYRP: 5.0000 mL | ORAL_SOLUTION | Freq: Three times a day (TID) | ORAL | Status: DC | PRN

## 2015-12-22 NOTE — Progress Notes (Signed)
Date:  12/22/2015   Name:  Darlene Newman   DOB:  Mar 26, 1961   MRN:  [id number]   Chief Complaint: Fever Fever  This is a new problem. The current episode started in the past 7 days (three days ago). The problem has been unchanged. The maximum temperature noted was 100 to 100.9 F. Associated symptoms include congestion, coughing, headaches, muscle aches, nausea, a sore throat and wheezing. Pertinent negatives include no vomiting. She has tried NSAIDs for the symptoms. The treatment provided mild relief.     Review of Systems  Constitutional: Positive for fever.  HENT: Positive for congestion and sore throat.   Respiratory: Positive for cough and wheezing.   Gastrointestinal: Positive for nausea. Negative for vomiting.  Neurological: Positive for headaches.    Patient Active Problem List   Diagnosis Date Noted  . Impingement syndrome of shoulder 05/20/2015  . Chronic right shoulder pain 04/19/2015  . HLD (hyperlipidemia) 02/17/2015  . Benign hypertension 02/17/2015  . Climacteric 02/17/2015  . Cervical pain 02/17/2015  . History of fundoplication 02/17/2015  . Tobacco use disorder, mild, in sustained remission 02/17/2015  . Calcification of coronary artery 12/25/2012  . Dermatitis contusiformis 05/31/2012  . Acid reflux 05/24/2012  . Lung mass 08/31/2011    Prior to Admission medications   Medication Sig Start Date End Date Taking? Authorizing Provider  dicyclomine (BENTYL) 10 MG capsule Take 1 capsule (10 mg total) by mouth 4 (four) times daily -  before meals and at bedtime. 12/13/15  Yes Reubin Milan, MD  losartan (COZAAR) 50 MG tablet TAKE 1 TABLET BY MOUTH EVERY DAY 06/17/15  Yes Reubin Milan, MD  metoprolol (LOPRESSOR) 50 MG tablet TAKE 1 TABLET BY MOUTH TWICE DAILY 06/17/15  Yes Reubin Milan, MD  omeprazole (PRILOSEC) 20 MG capsule Take 1 capsule (20 mg total) by mouth daily. 04/19/15  Yes Reubin Milan, MD  PARoxetine (PAXIL) 20 MG tablet Take 1 tablet  (20 mg total) by mouth daily. 04/19/15  Yes Reubin Milan, MD  PROVENTIL HFA [phone] BASE) MCG/ACT inhaler Inhale 2 puffs into the lungs 4 (four) times daily as needed. 02/02/15  Yes Historical Provider, MD  simvastatin (ZOCOR) 40 MG tablet Take 1 tablet (40 mg total) by mouth daily. 04/19/15  Yes Reubin Milan, MD    No Known Allergies  Past Surgical History  Procedure Laterality Date  . Hiatal hernia repair  2015  . Total vaginal hysterectomy  2012  . Abdominal hysterectomy      Social History  Substance Use Topics  . Smoking status: Former Smoker    Quit date: 10/30/2006  . Smokeless tobacco: Never Used  . Alcohol Use: No    Medication list has been reviewed and updated.  Physical Exam  Constitutional: She is oriented to person, place, and time. She appears well-developed. She appears ill. No distress.  HENT:  Head: Normocephalic and atraumatic.  Right Ear: Tympanic membrane, external ear and ear canal normal.  Left Ear: Tympanic membrane, external ear and ear canal normal.  Mouth/Throat: Oropharynx is clear and moist.  Neck: Normal range of motion. Neck supple.  Cardiovascular: Normal rate, regular rhythm and normal heart sounds.   Pulmonary/Chest: Effort normal and breath sounds normal. No accessory muscle usage.  Musculoskeletal: Normal range of motion.  Neurological: She is alert and oriented to person, place, and time.  Skin: Skin is warm and dry. No rash noted.  Psychiatric: She has a normal mood and affect.  Her behavior is normal. Thought content normal.    BP 116/78 mmHg  Pulse 87  Temp(Src) 99.7 F (37.6 C) (Oral)  Ht  (1.6 m)  Wt 132 lb (59.875 kg)  BMI 23.39 kg/m2  SpO2 97%  Assessment and Plan: 1. Influenza advil or tylenol every 6 hours for headache and fever Return to work 12/26/15 - POCT Influenza A/B - guaiFENesin-codeine (ROBITUSSIN AC) 100-10 MG/5ML syrup; Take 5 mLs by mouth 3 (three) times daily as needed for cough.  Dispense: 236 mL;  Refill: 0   Bari Edward, MD [facility]  12/22/2015

## 2015-12-22 NOTE — Patient Instructions (Signed)

## 2015-12-27 ENCOUNTER — Other Ambulatory Visit: Payer: Self-pay | Admitting: Internal Medicine

## 2016-04-24 ENCOUNTER — Other Ambulatory Visit: Payer: Self-pay | Admitting: Internal Medicine

## 2016-05-05 ENCOUNTER — Other Ambulatory Visit: Payer: Self-pay | Admitting: Internal Medicine

## 2016-05-10 NOTE — Telephone Encounter (Signed)
pts coming in on 12/15 for her cpe

## 2016-06-08 ENCOUNTER — Encounter: Payer: Self-pay | Admitting: Internal Medicine

## 2016-06-08 ENCOUNTER — Ambulatory Visit (INDEPENDENT_AMBULATORY_CARE_PROVIDER_SITE_OTHER): Payer: Managed Care, Other (non HMO) | Admitting: Internal Medicine

## 2016-06-08 VITALS — BP 122/82 | HR 54 | Resp 16 | Ht 63.0 in | Wt 135.4 lb

## 2016-06-08 DIAGNOSIS — M67479 Ganglion, unspecified ankle and foot: Secondary | ICD-10-CM

## 2016-06-08 DIAGNOSIS — M7541 Impingement syndrome of right shoulder: Secondary | ICD-10-CM | POA: Diagnosis not present

## 2016-06-08 DIAGNOSIS — Z1239 Encounter for other screening for malignant neoplasm of breast: Secondary | ICD-10-CM

## 2016-06-08 DIAGNOSIS — Z1159 Encounter for screening for other viral diseases: Secondary | ICD-10-CM | POA: Diagnosis not present

## 2016-06-08 DIAGNOSIS — N951 Menopausal and female climacteric states: Secondary | ICD-10-CM

## 2016-06-08 DIAGNOSIS — I10 Essential (primary) hypertension: Secondary | ICD-10-CM | POA: Diagnosis not present

## 2016-06-08 DIAGNOSIS — K219 Gastro-esophageal reflux disease without esophagitis: Secondary | ICD-10-CM

## 2016-06-08 DIAGNOSIS — E781 Pure hyperglyceridemia: Secondary | ICD-10-CM

## 2016-06-08 DIAGNOSIS — H9313 Tinnitus, bilateral: Secondary | ICD-10-CM

## 2016-06-08 DIAGNOSIS — Z0000 Encounter for general adult medical examination without abnormal findings: Secondary | ICD-10-CM | POA: Diagnosis not present

## 2016-06-08 LAB — POCT URINALYSIS DIPSTICK
Bilirubin, UA: NEGATIVE
Glucose, UA: NEGATIVE
Ketones, UA: NEGATIVE
Nitrite, UA: POSITIVE
PROTEIN UA: NEGATIVE
SPEC GRAV UA: 1.01
pH, UA: 7

## 2016-06-08 NOTE — Patient Instructions (Signed)
DASH Eating Plan  DASH stands for "Dietary Approaches to Stop Hypertension." The DASH eating plan is a healthy eating plan that has been shown to reduce high blood pressure (hypertension). Additional health benefits may include reducing the risk of type 2 diabetes mellitus, heart disease, and stroke. The DASH eating plan may also help with weight loss.  WHAT DO I NEED TO KNOW ABOUT THE DASH EATING PLAN?  For the DASH eating plan, you will follow these general guidelines:  · Choose foods with a percent daily value for sodium of less than 5% (as listed on the food label).  · Use salt-free seasonings or herbs instead of table salt or sea salt.  · Check with your health care provider or pharmacist before using salt substitutes.  · Eat lower-sodium products, often labeled as "lower sodium" or "no salt added."  · Eat fresh foods.  · Eat more vegetables, fruits, and low-fat dairy products.  · Choose whole grains. Look for the word "whole" as the first word in the ingredient list.  · Choose fish and skinless chicken or turkey more often than red meat. Limit fish, poultry, and meat to 6 oz (170 g) each day.  · Limit sweets, desserts, sugars, and sugary drinks.  · Choose heart-healthy fats.  · Limit cheese to 1 oz (28 g) per day.  · Eat more home-cooked food and less restaurant, buffet, and fast food.  · Limit fried foods.  · Cook foods using methods other than frying.  · Limit canned vegetables. If you do use them, rinse them well to decrease the sodium.  · When eating at a restaurant, ask that your food be prepared with less salt, or no salt if possible.  WHAT FOODS CAN I EAT?  Seek help from a dietitian for individual calorie needs.  Grains  Whole grain or whole wheat bread. Brown rice. Whole grain or whole wheat pasta. Quinoa, bulgur, and whole grain cereals. Low-sodium cereals. Corn or whole wheat flour tortillas. Whole grain cornbread. Whole grain crackers. Low-sodium crackers.  Vegetables  Fresh or frozen vegetables  (raw, steamed, roasted, or grilled). Low-sodium or reduced-sodium tomato and vegetable juices. Low-sodium or reduced-sodium tomato sauce and paste. Low-sodium or reduced-sodium canned vegetables.   Fruits  All fresh, canned (in natural juice), or frozen fruits.  Meat and Other Protein Products  Ground beef (85% or leaner), grass-fed beef, or beef trimmed of fat. Skinless chicken or turkey. Ground chicken or turkey. Pork trimmed of fat. All fish and seafood. Eggs. Dried beans, peas, or lentils. Unsalted nuts and seeds. Unsalted canned beans.  Dairy  Low-fat dairy products, such as skim or 1% milk, 2% or reduced-fat cheeses, low-fat ricotta or cottage cheese, or plain low-fat yogurt. Low-sodium or reduced-sodium cheeses.  Fats and Oils  Tub margarines without trans fats. Light or reduced-fat mayonnaise and salad dressings (reduced sodium). Avocado. Safflower, olive, or canola oils. Natural peanut or almond butter.  Other  Unsalted popcorn and pretzels.  The items listed above may not be a complete list of recommended foods or beverages. Contact your dietitian for more options.  WHAT FOODS ARE NOT RECOMMENDED?  Grains  White bread. White pasta. White rice. Refined cornbread. Bagels and croissants. Crackers that contain trans fat.  Vegetables  Creamed or fried vegetables. Vegetables in a cheese sauce. Regular canned vegetables. Regular canned tomato sauce and paste. Regular tomato and vegetable juices.  Fruits  Dried fruits. Canned fruit in light or heavy syrup. Fruit juice.  Meat and Other Protein   Products  Fatty cuts of meat. Ribs, chicken wings, bacon, sausage, bologna, salami, chitterlings, fatback, hot dogs, bratwurst, and packaged luncheon meats. Salted nuts and seeds. Canned beans with salt.  Dairy  Whole or 2% milk, cream, half-and-half, and cream cheese. Whole-fat or sweetened yogurt. Full-fat cheeses or blue cheese. Nondairy creamers and whipped toppings. Processed cheese, cheese spreads, or cheese  curds.  Condiments  Onion and garlic salt, seasoned salt, table salt, and sea salt. Canned and packaged gravies. Worcestershire sauce. Tartar sauce. Barbecue sauce. Teriyaki sauce. Soy sauce, including reduced sodium. Steak sauce. Fish sauce. Oyster sauce. Cocktail sauce. Horseradish. Ketchup and mustard. Meat flavorings and tenderizers. Bouillon cubes. Hot sauce. Tabasco sauce. Marinades. Taco seasonings. Relishes.  Fats and Oils  Butter, stick margarine, lard, shortening, ghee, and bacon fat. Coconut, palm kernel, or palm oils. Regular salad dressings.  Other  Pickles and olives. Salted popcorn and pretzels.  The items listed above may not be a complete list of foods and beverages to avoid. Contact your dietitian for more information.  WHERE CAN I FIND MORE INFORMATION?  National Heart, Lung, and [employer] Institute: www.nhlbi.nih.gov/health/health-topics/topics/dash/     This information is not intended to replace advice given to you by your health care provider. Make sure you discuss any questions you have with your health care provider.     Document Released: 10/05/2011 Document Revised: 11/06/2014 Document Reviewed: 08/20/2013  Elsevier Interactive Patient Education ©2016 [employer]

## 2016-06-08 NOTE — Progress Notes (Signed)
Date:  06/08/2016   Name:  Shonnie Poudrier   DOB:  1961-04-20   MRN:  [id number]   Chief Complaint: Annual Exam (stomach bug Tue - NVD ); Neck Pain ( 2-3 months no injury ); Plantar Fasciitis (has spot on right foot that hurts -); and Cyst (under right arm 1 year )  Verdell Kincannon is a 55 y.o. female who presents today for her Complete Annual Exam. She feels fairly well. She reports exercising none. She reports she is sleeping fairly well. She is due for a Mammogram.  Neck Pain   This is a new problem. The current episode started more than 1 month ago. Associated symptoms include headaches. Pertinent negatives include no chest pain, fever, trouble swallowing or weakness.  Hypertension  This is a chronic problem. The current episode started more than 1 year ago. The problem is unchanged. The problem is controlled. Associated symptoms include headaches and neck pain. Pertinent negatives include no chest pain, palpitations or shortness of breath.  Hyperlipidemia  This is a chronic problem. The problem is controlled. Recent lipid tests were reviewed and are normal. Pertinent negatives include no chest pain or shortness of breath. Current antihyperlipidemic treatment includes statins.  Gastroesophageal Reflux  She complains of abdominal pain and heartburn. She reports no chest pain, no coughing or no wheezing. The problem has been resolved. The heartburn does not wake her from sleep. The heartburn does not limit her activity. The heartburn doesn't change with position. Pertinent negatives include no fatigue. She has tried a PPI for the symptoms. The treatment provided significant relief.   IBS - still has intermittent bloating and gas as well as episodes of constipation.  Bentyl has helped her sx to a degree.  She also continues on PPI.    Review of Systems  Constitutional: Negative for chills, fatigue and fever.  HENT: Positive for hearing loss and tinnitus. Negative for trouble swallowing  and voice change.   Respiratory: Negative for cough, chest tightness, shortness of breath and wheezing.   Cardiovascular: Negative for chest pain, palpitations and leg swelling.  Gastrointestinal: Positive for abdominal distention, abdominal pain, constipation, diarrhea and heartburn. Negative for blood in stool.  Genitourinary: Negative for difficulty urinating, frequency, vaginal bleeding and vaginal discharge.  Musculoskeletal: Positive for arthralgias (right shoulder) and neck pain.  Neurological: Positive for dizziness and headaches. Negative for tremors, syncope and weakness.  Hematological: Negative for adenopathy. Does not bruise/bleed easily.  Psychiatric/Behavioral: Positive for sleep disturbance. Negative for dysphoric mood. The patient is not nervous/anxious.     Patient Active Problem List   Diagnosis Date Noted  . Impingement syndrome of right shoulder 05/20/2015  . Hypertriglyceridemia 02/17/2015  . Benign hypertension 02/17/2015  . Climacteric 02/17/2015  . Cervical pain 02/17/2015  . History of fundoplication 02/17/2015  . Tobacco use disorder, mild, in sustained remission 02/17/2015  . Calcification of coronary artery 12/25/2012  . Dermatitis contusiformis 05/31/2012  . Acid reflux 05/24/2012  . Lung mass 08/31/2011    Prior to Admission medications   Medication Sig Start Date End Date Taking? Authorizing Provider  albuterol (PROVENTIL HFA;VENTOLIN HFA) 108 (90 Base) MCG/ACT inhaler Inhale into the lungs. 02/02/15  Yes Historical Provider, MD  dicyclomine (BENTYL) 10 MG capsule Take 1 capsule (10 mg total) by mouth 4 (four) times daily -  before meals and at bedtime. 12/13/15  Yes Reubin Milan, MD  losartan (COZAAR) 50 MG tablet Take by mouth.   Yes Historical Provider, MD  metoprolol (  LOPRESSOR) 50 MG tablet Take by mouth. 05/21/14  Yes Historical Provider, MD  omeprazole (PRILOSEC) 20 MG capsule TAKE 1 CAPSULE BY MOUTH ONCE DAILY. 12/27/15  Yes Reubin Milan, MD    PARoxetine (PAXIL) 20 MG tablet TAKE 1 TABLET BY MOUTH ONCE DAILY. 04/24/16  Yes Reubin Milan, MD  simvastatin (ZOCOR) 40 MG tablet TAKE 1 TABLET BY MOUTH ONCE DAILY. 05/05/16  Yes Reubin Milan, MD    No Known Allergies  Past Surgical History:  Procedure Laterality Date  . HIATAL HERNIA REPAIR  2015  . TOTAL VAGINAL HYSTERECTOMY  2012    Social History  Substance Use Topics  . Smoking status: Former Smoker    Quit date: 10/30/2006  . Smokeless tobacco: Never Used  . Alcohol use No     Medication list has been reviewed and updated.   Physical Exam  Constitutional: She is oriented to person, place, and time. She appears well-developed and well-nourished. No distress.  HENT:  Head: Normocephalic and atraumatic.  Right Ear: Tympanic membrane and ear canal normal.  Left Ear: Ear canal normal. Tympanic membrane is scarred.  Nose: Right sinus exhibits no maxillary sinus tenderness. Left sinus exhibits no maxillary sinus tenderness.  Mouth/Throat: Uvula is midline and oropharynx is clear and moist. No oral lesions. No posterior oropharyngeal erythema.  Eyes: Conjunctivae and EOM are normal. Right eye exhibits no discharge. Left eye exhibits no discharge. No scleral icterus.  Neck: Normal range of motion. Carotid bruit is not present. No erythema present. No thyromegaly present.  Cardiovascular: Normal rate, regular rhythm, normal heart sounds and normal pulses.   Pulmonary/Chest: Effort normal. No respiratory distress. She has no wheezes. Right breast exhibits no mass, no nipple discharge, no skin change and no tenderness. Left breast exhibits no mass, no nipple discharge, no skin change and no tenderness.  Abdominal: Soft. Bowel sounds are normal. There is no hepatosplenomegaly. There is no tenderness. There is no CVA tenderness.  Musculoskeletal: Normal range of motion.       Cervical back: She exhibits tenderness and spasm.  Nodule on plantar fascia right foot   Lymphadenopathy:    She has no cervical adenopathy.    She has no axillary adenopathy.  Neurological: She is alert and oriented to person, place, and time. She has normal strength and normal reflexes. No cranial nerve deficit or sensory deficit. Coordination and gait normal.  Skin: Skin is warm, dry and intact. No rash noted.  Psychiatric: She has a normal mood and affect. Her speech is normal and behavior is normal. Thought content normal.  Nursing note and vitals reviewed.   BP 122/82 (BP Location: Right Arm, Patient Position: Sitting, Cuff Size: Normal)   Pulse (!) 54   Resp 16   Ht  (1.6 m)   Wt 135 lb 6.4 oz (61.4 kg)   SpO2 100%   BMI 23.99 kg/m   Assessment and Plan: 1. Annual physical exam Continue current medications  2. Breast cancer screening - MM DIGITAL SCREENING BILATERAL; Future  3. Benign hypertension controlled - CBC with Differential/Platelet - Comprehensive metabolic panel - TSH - POCT urinalysis dipstick  4. Gastroesophageal reflux disease without esophagitis stable  5. Hypertriglyceridemia On statin therapy - Lipid panel  6. Climacteric Continue Paxil  7. Need for hepatitis C screening test - Hepatitis C antibody  8. Tinnitus aurium, bilateral - Ambulatory referral to ENT  9. Impingement syndrome of right shoulder Recommend Advil 400 mg bid and heat/ice after work Follow up  with Ortho if desired  10. Ganglion cyst of foot Consider Podiatry evaluation   Bari EdwardLaura Berglund, MD [facility]  06/08/2016

## 2016-06-09 ENCOUNTER — Other Ambulatory Visit: Payer: Self-pay | Admitting: Internal Medicine

## 2016-06-09 LAB — CBC WITH DIFFERENTIAL/PLATELET
BASOS ABS: 0 10*3/uL (ref 0.0–0.2)
BASOS: 0 %
EOS (ABSOLUTE): 0.1 10*3/uL (ref 0.0–0.4)
Eos: 2 %
HEMOGLOBIN: 12.2 g/dL (ref 11.1–15.9)
Hematocrit: 36.1 % (ref 34.0–46.6)
IMMATURE GRANS (ABS): 0 10*3/uL (ref 0.0–0.1)
Immature Granulocytes: 0 %
LYMPHS ABS: 1.7 10*3/uL (ref 0.7–3.1)
Lymphs: 25 %
MCH: 33.2 pg — AB (ref 26.6–33.0)
MCHC: 33.8 g/dL (ref 31.5–35.7)
MCV: 98 fL — AB (ref 79–97)
MONOCYTES: 11 %
Monocytes Absolute: 0.8 10*3/uL (ref 0.1–0.9)
NEUTROS ABS: 4.3 10*3/uL (ref 1.4–7.0)
Neutrophils: 62 %
Platelets: 203 10*3/uL (ref 150–379)
RBC: 3.68 x10E6/uL — ABNORMAL LOW (ref 3.77–5.28)
RDW: 13.9 % (ref 12.3–15.4)
WBC: 6.9 10*3/uL (ref 3.4–10.8)

## 2016-06-09 LAB — LIPID PANEL
Chol/HDL Ratio: 4.1 ratio units (ref 0.0–4.4)
Cholesterol, Total: 165 mg/dL (ref 100–199)
HDL: 40 mg/dL (ref >39)
LDL Calculated: 86 mg/dL (ref 0–99)
Triglycerides: 196 mg/dL — ABNORMAL HIGH (ref 0–149)
VLDL Cholesterol Cal: 39 mg/dL (ref 5–40)

## 2016-06-09 LAB — COMPREHENSIVE METABOLIC PANEL
A/G RATIO: 1.6 (ref 1.2–2.2)
ALBUMIN: 4.3 g/dL (ref 3.5–5.5)
ALK PHOS: 112 IU/L (ref 39–117)
ALT: 20 IU/L (ref 0–32)
AST: 19 IU/L (ref 0–40)
BILIRUBIN TOTAL: 0.6 mg/dL (ref 0.0–1.2)
BUN/Creatinine Ratio: 20 (ref 9–23)
BUN: 22 mg/dL (ref 6–24)
CHLORIDE: 100 mmol/L (ref 96–106)
CO2: 22 mmol/L (ref 18–29)
Calcium: 9.3 mg/dL (ref 8.7–10.2)
Creatinine, Ser: 1.1 mg/dL — ABNORMAL HIGH (ref 0.57–1.00)
GFR calc non Af Amer: 57 mL/min/{1.73_m2} — ABNORMAL LOW (ref >59)
GFR, EST AFRICAN AMERICAN: 66 mL/min/{1.73_m2} (ref >59)
GLUCOSE: 88 mg/dL (ref 65–99)
Globulin, Total: 2.7 g/dL (ref 1.5–4.5)
POTASSIUM: 3.4 mmol/L — AB (ref 3.5–5.2)
SODIUM: 143 mmol/L (ref 134–144)
TOTAL PROTEIN: 7 g/dL (ref 6.0–8.5)

## 2016-06-09 LAB — TSH: TSH: 10.05 u[IU]/mL — AB (ref 0.450–4.500)

## 2016-06-09 LAB — HEPATITIS C ANTIBODY

## 2016-06-09 MED ORDER — LEVOTHYROXINE SODIUM 50 MCG PO TABS: 50.0000 ug | ORAL_TABLET | Freq: Every day | ORAL | 3 refills | Status: DC

## 2016-07-12 ENCOUNTER — Other Ambulatory Visit: Payer: Self-pay | Admitting: Unknown Physician Specialty

## 2016-07-12 DIAGNOSIS — G8929 Other chronic pain: Principal | ICD-10-CM

## 2016-07-12 DIAGNOSIS — M542 Cervicalgia: Secondary | ICD-10-CM

## 2016-07-22 ENCOUNTER — Ambulatory Visit
Admission: RE | Admit: 2016-07-22 | Discharge: 2016-07-22 | Disposition: A | Discharge status: Home / Self Care | Payer: Managed Care, Other (non HMO) | Source: Ambulatory Visit | Attending: Unknown Physician Specialty | Admitting: Unknown Physician Specialty

## 2016-07-22 DIAGNOSIS — G8929 Other chronic pain: Secondary | ICD-10-CM | POA: Insufficient documentation

## 2016-07-22 DIAGNOSIS — M50323 Other cervical disc degeneration at C6-C7 level: Secondary | ICD-10-CM | POA: Diagnosis not present

## 2016-07-22 DIAGNOSIS — R600 Localized edema: Secondary | ICD-10-CM | POA: Diagnosis not present

## 2016-07-22 DIAGNOSIS — M542 Cervicalgia: Secondary | ICD-10-CM

## 2016-08-07 ENCOUNTER — Encounter: Payer: Self-pay | Admitting: Emergency Medicine

## 2016-08-07 ENCOUNTER — Emergency Department: Payer: Managed Care, Other (non HMO)

## 2016-08-07 ENCOUNTER — Emergency Department
Admission: EM | Admit: 2016-08-07 | Discharge: 2016-08-07 | Disposition: A | Discharge status: Home / Self Care | Payer: Managed Care, Other (non HMO) | Attending: Emergency Medicine | Admitting: Emergency Medicine

## 2016-08-07 DIAGNOSIS — W06XXXA Fall from bed, initial encounter: Secondary | ICD-10-CM | POA: Insufficient documentation

## 2016-08-07 DIAGNOSIS — N3000 Acute cystitis without hematuria: Secondary | ICD-10-CM | POA: Diagnosis not present

## 2016-08-07 DIAGNOSIS — Z79899 Other long term (current) drug therapy: Secondary | ICD-10-CM | POA: Insufficient documentation

## 2016-08-07 DIAGNOSIS — I2510 Atherosclerotic heart disease of native coronary artery without angina pectoris: Secondary | ICD-10-CM | POA: Diagnosis not present

## 2016-08-07 DIAGNOSIS — I10 Essential (primary) hypertension: Secondary | ICD-10-CM | POA: Insufficient documentation

## 2016-08-07 DIAGNOSIS — E860 Dehydration: Secondary | ICD-10-CM | POA: Diagnosis not present

## 2016-08-07 DIAGNOSIS — R55 Syncope and collapse: Secondary | ICD-10-CM

## 2016-08-07 DIAGNOSIS — S99921A Unspecified injury of right foot, initial encounter: Secondary | ICD-10-CM

## 2016-08-07 DIAGNOSIS — Y929 Unspecified place or not applicable: Secondary | ICD-10-CM | POA: Insufficient documentation

## 2016-08-07 DIAGNOSIS — Y990 Civilian activity done for income or pay: Secondary | ICD-10-CM | POA: Diagnosis not present

## 2016-08-07 DIAGNOSIS — Z87891 Personal history of nicotine dependence: Secondary | ICD-10-CM | POA: Insufficient documentation

## 2016-08-07 DIAGNOSIS — Y9389 Activity, other specified: Secondary | ICD-10-CM | POA: Insufficient documentation

## 2016-08-07 LAB — CBC
HCT: 40.7 % (ref 35.0–47.0)
Hemoglobin: 13.8 g/dL (ref 12.0–16.0)
MCH: 32.8 pg (ref 26.0–34.0)
MCHC: 33.9 g/dL (ref 32.0–36.0)
MCV: 96.9 fL (ref 80.0–100.0)
Platelets: 292 10*3/uL (ref 150–440)
RBC: 4.2 MIL/uL (ref 3.80–5.20)
RDW: 13.2 % (ref 11.5–14.5)
WBC: 14.6 10*3/uL — ABNORMAL HIGH (ref 3.6–11.0)

## 2016-08-07 LAB — BASIC METABOLIC PANEL
Anion gap: 12 (ref 5–15)
BUN: 34 mg/dL — AB (ref 6–20)
CALCIUM: 9.3 mg/dL (ref 8.9–10.3)
CHLORIDE: 105 mmol/L (ref 101–111)
CO2: 25 mmol/L (ref 22–32)
CREATININE: 1.28 mg/dL — AB (ref 0.44–1.00)
GFR calc Af Amer: 54 mL/min — ABNORMAL LOW (ref >60)
GFR, EST NON AFRICAN AMERICAN: 47 mL/min — AB (ref >60)
GLUCOSE: 96 mg/dL (ref 65–99)
POTASSIUM: 3.5 mmol/L (ref 3.5–5.1)
SODIUM: 142 mmol/L (ref 135–145)

## 2016-08-07 LAB — URINE DRUG SCREEN, QUALITATIVE (ARMC ONLY)
AMPHETAMINES, UR SCREEN: NOT DETECTED
BENZODIAZEPINE, UR SCRN: NOT DETECTED
Barbiturates, Ur Screen: NOT DETECTED
Cannabinoid 50 Ng, Ur ~~LOC~~: NOT DETECTED
Cocaine Metabolite,Ur ~~LOC~~: NOT DETECTED
MDMA (Ecstasy)Ur Screen: NOT DETECTED
METHADONE SCREEN, URINE: NOT DETECTED
OPIATE, UR SCREEN: NOT DETECTED
Phencyclidine (PCP) Ur S: NOT DETECTED
TRICYCLIC, UR SCREEN: NOT DETECTED

## 2016-08-07 LAB — MAGNESIUM: Magnesium: 1.8 mg/dL (ref 1.7–2.4)

## 2016-08-07 LAB — URINALYSIS COMPLETE WITH MICROSCOPIC (ARMC ONLY)
Bilirubin Urine: NEGATIVE
GLUCOSE, UA: NEGATIVE mg/dL
Hgb urine dipstick: NEGATIVE
KETONES UR: NEGATIVE mg/dL
NITRITE: POSITIVE — AB
Protein, ur: NEGATIVE mg/dL
SPECIFIC GRAVITY, URINE: 1.017 (ref 1.005–1.030)
pH: 5 (ref 5.0–8.0)

## 2016-08-07 LAB — GLUCOSE, CAPILLARY: GLUCOSE-CAPILLARY: 96 mg/dL (ref 65–99)

## 2016-08-07 MED ORDER — CEPHALEXIN 500 MG PO CAPS: 500.0000 mg | ORAL_CAPSULE | Freq: Three times a day (TID) | ORAL | 0 refills | Status: AC

## 2016-08-07 MED ORDER — SODIUM CHLORIDE 0.9 % IV BOLUS (SEPSIS)
500.0000 mL | Freq: Once | INTRAVENOUS | Status: AC
  Administered 2016-08-07: 500 mL via INTRAVENOUS

## 2016-08-07 MED ORDER — CEPHALEXIN 500 MG PO CAPS
500.0000 mg | ORAL_CAPSULE | Freq: Once | ORAL | Status: AC
  Administered 2016-08-07: 500 mg via ORAL
  Filled 2016-08-07: qty 1

## 2016-08-07 MED ORDER — TRAMADOL HCL 50 MG PO TABS: 50.0000 mg | ORAL_TABLET | Freq: Four times a day (QID) | ORAL | 0 refills | Status: DC | PRN

## 2016-08-07 MED ORDER — SODIUM CHLORIDE 0.9 % IV BOLUS (SEPSIS)
1000.0000 mL | Freq: Once | INTRAVENOUS | Status: AC
  Administered 2016-08-07: 1000 mL via INTRAVENOUS

## 2016-08-07 MED ORDER — FENTANYL CITRATE (PF) 100 MCG/2ML IJ SOLN
50.0000 ug | Freq: Once | INTRAMUSCULAR | Status: AC
  Administered 2016-08-07: 50 ug via INTRAVENOUS
  Filled 2016-08-07: qty 2

## 2016-08-07 NOTE — ED Provider Notes (Addendum)
[facility] Emergency Department Provider Note  ____________________________________________   I have reviewed the triage vital signs and the nursing notes.   HISTORY  Chief Complaint Loss of Consciousness and Fall    HPI Darlene Newman is a 55 y.o. female states that she was having some cramps or leg. She works nights and was sleeping. She had bilateral leg cramps, she jumped up in a hurry to try to stretch them out and that she passed out. She fell she bumped her right foot andand it was too painful for her to get back up and walk afterwards. She states that she did not hit her head. She did not bite her tongue or having problems of bowel or bladder. She was not confused. She is otherwise in her normal state of health. She has a history of an esophageal sheath and she states she has had pain with food for years that is not different today. Denies any focal numbness or weakness at this time.     Past Medical History:  Diagnosis Date  . Acute cystitis   . CAD (coronary artery disease)   . Chronic right shoulder pain   . Depression   . GERD (gastroesophageal reflux disease)   . Hyperlipidemia   . Hypertension   . Lung mass     Patient Active Problem List   Diagnosis Date Noted  . Impingement syndrome of right shoulder 05/20/2015  . Hypertriglyceridemia 02/17/2015  . Benign hypertension 02/17/2015  . Climacteric 02/17/2015  . Cervical pain 02/17/2015  . History of fundoplication 02/17/2015  . Tobacco use disorder, mild, in sustained remission 02/17/2015  . Calcification of coronary artery 12/25/2012  . Dermatitis contusiformis 05/31/2012  . Acid reflux 05/24/2012  . Lung mass 08/31/2011    Past Surgical History:  Procedure Laterality Date  . HIATAL HERNIA REPAIR  2015  . TOTAL VAGINAL HYSTERECTOMY  2012    Prior to Admission medications   Medication Sig Start Date End Date Taking? Authorizing Provider  albuterol (PROVENTIL HFA;VENTOLIN  HFA) 108 (90 Base) MCG/ACT inhaler Inhale into the lungs. 02/02/15   Historical Provider, MD  dicyclomine (BENTYL) 10 MG capsule Take 1 capsule (10 mg total) by mouth 4 (four) times daily -  before meals and at bedtime. 12/13/15   Reubin MilanLaura H Berglund, MD  levothyroxine (SYNTHROID, LEVOTHROID) 50 MCG tablet Take 1 tablet (50 mcg total) by mouth daily. 06/09/16   Reubin MilanLaura H Berglund, MD  losartan (COZAAR) 50 MG tablet Take by mouth.    Historical Provider, MD  metoprolol (LOPRESSOR) 50 MG tablet Take by mouth. 05/21/14   Historical Provider, MD  omeprazole (PRILOSEC) 20 MG capsule TAKE 1 CAPSULE BY MOUTH ONCE DAILY. 12/27/15   Reubin MilanLaura H Berglund, MD  PARoxetine (PAXIL) 20 MG tablet TAKE 1 TABLET BY MOUTH ONCE DAILY. 04/24/16   Reubin MilanLaura H Berglund, MD  simvastatin (ZOCOR) 40 MG tablet TAKE 1 TABLET BY MOUTH ONCE DAILY. 05/05/16   Reubin MilanLaura H Berglund, MD    Allergies Review of patient's allergies indicates no known allergies.  Family History  Problem Relation Age of Onset  . Diabetes Mother   . Heart disease Mother   . Diabetes Sister     Social History Social History  Substance Use Topics  . Smoking status: Former Smoker    Quit date: 10/30/2006  . Smokeless tobacco: Never Used  . Alcohol use No    Review of Systems Constitutional: No fever/chills Eyes: No visual changes. ENT: No sore throat. No stiff neck  no neck pain Cardiovascular: Denies chest pain. Respiratory: Denies shortness of breath. Gastrointestinal:   no vomiting.  No diarrhea.  No constipation. Genitourinary: Negative for dysuria. Musculoskeletal: Negative lower extremity swelling Skin: Negative for rash. Neurological: Negative for severe headaches, focal weakness or numbness. 10-point ROS otherwise negative.  ____________________________________________   PHYSICAL EXAM:  VITAL SIGNS: ED Triage Vitals  Enc Vitals Group     BP 08/07/16 1215 (!) 150/79     Pulse Rate 08/07/16 1215 66     Resp 08/07/16 1215 18     Temp 08/07/16  1215 98.2 F (36.8 C)     Temp Source 08/07/16 1215 Oral     SpO2 08/07/16 1215 100 %     Weight 08/07/16 1216 135 lb (61.2 kg)     Height 08/07/16 1216 5\' 2"  (1.575 m)     Head Circumference --      Peak Flow --      Pain Score 08/07/16 1216 10     Pain Loc --      Pain Edu? --      Excl. in GC? --     Constitutional: Alert and oriented. Well appearing and in no acute distress. Eyes: Conjunctivae are normal. PERRL. EOMI. Head: Atraumatic. Nose: No congestion/rhinnorhea. Mouth/Throat: Mucous membranes are moist.  Oropharynx non-erythematous. Neck: No stridor.   Nontender with no meningismus Cardiovascular: Normal rate, regular rhythm. Grossly normal heart sounds.  Good peripheral circulation. Respiratory: Normal respiratory effort.  No retractions. Lungs CTAB. Abdominal: Soft and nontender. No distention. No guarding no rebound Back:  There is no focal tenderness or step off.  there is no midline tenderness there are no lesions noted. there is no CVA tenderness Musculoskeletal: No lower extremity tenderness, no upper extremity tenderness. No joint effusions, no DVT signs strong distal pulses no edema Neurologic:  Normal speech and language. No gross focal neurologic deficits are appreciated. Right foot: To palpation in the distal right forefoot dorsally with minimal swelling, strong pulses compartments are soft Skin:  Skin is warm, dry and intact. No rash noted. Psychiatric: Mood and affect are normal. Speech and behavior are normal.  ____________________________________________   LABS (all labs ordered are listed, but only abnormal results are displayed)  Labs Reviewed  CBC - Abnormal; Notable for the following:       Result Value   WBC 14.6 (*)    All other components within normal limits  GLUCOSE, CAPILLARY  BASIC METABOLIC PANEL  URINALYSIS COMPLETEWITH MICROSCOPIC ([facility] ONLY)  MAGNESIUM  URINE DRUG SCREEN, QUALITATIVE ([facility] ONLY)    ____________________________________________  EKG  I personally interpreted any EKGs ordered by me or triage Normal sinus rhythm rate 67 beats per an acute ST elevation or acute ST depression normal axis unremarkable EKG ____________________________________________  RADIOLOGY  I reviewed any imaging ordered by me or triage that were performed during my shift and, if possible, patient and/or family made aware of any abnormal findings. ____________________________________________   PROCEDURES  Procedure(s) performed: None  Procedures  Critical Care performed: None  ____________________________________________   INITIAL IMPRESSION / ASSESSMENT AND PLAN / ED COURSE  Pertinent labs & imaging results that were available during my care of the patient were reviewed by me and considered in my medical decision making (see chart for details).  This was somewhat unusual constellation of events this morning. She had leg cramps jumped out of bed felt lightheaded passed out and hurt her foot.  ----------------------------------------- 2:54 PM on 08/07/2016 -----------------------------------------  Patient no acute distress,  there is some bruising to the foot but no evidence of injury, no evidence of electrolyte disturbance causing her to pass out, EKG is reassuring blood work is reassuring vital signs are reassuring, patient is without any symptoms at this time. She does have a history of a positional change followed by a brief episode of less of consciousness with no evidence of ongoing dysrhythmia. Patient would prefer not to stay in the hospital. We are waiting her urinalysis. She did appear slightly dehydrated to me and we are giving IV fluids. Not specifically noted is a small elevation of white count with no other evidence of infection including no evidence of hyperthermia or hyperthermia, no cough no shortness of breath, sats are 100%, we will see if her urinalysis is positive but  otherwise no evidence of infection to account for a very nonspecific white count. She does also appear to be somewhat dehydrated. She works Network engineer in general to the [employer] we're getting if her IV fluids to see if we can help clear up some of that mild prerenal as ischemia. I think patient likely was dehydrated having cramps jumped out of bed passed out and hurt her foot. Patient declines offered admission. We will have her follow closely with orthopedic surgery for her foot, there is no evidence of compartment syndrome or DVT in either leg. We will also have her follow up closely with primary care doctor for her syncopal event we'll give her a work note and return precautions and follow-up given and understood.  ----------------------------------------- 3:35 PM on 08/07/2016 -----------------------------------------  Patient sitting up with no evidence of lightheadedness feels 100% better requesting discharge. I did offer her a work note she states "the work note has to be for 3 days".  Clinical Course   ____________________________________________   FINAL CLINICAL IMPRESSION(S) / ED DIAGNOSES  Final diagnoses:  None      This chart was dictated using voice recognition software.  Despite best efforts to proofread,  errors can occur which can change meaning.      Jeanmarie Plant, MD 08/07/16 1251    Jeanmarie Plant, MD 08/07/16 1456    Jeanmarie Plant, MD 08/07/16 1535

## 2016-08-07 NOTE — ED Notes (Signed)
Urine sent to lab 

## 2016-08-07 NOTE — ED Triage Notes (Signed)
Pt presents to ED from home c/o syncope with LOC and fall with swelling to right foot today. Pt states she stood up and had cramps in her LE that she has never had before then passed out; afterwards woke up and crawled to phone to dial 911. A/o x4 at present

## 2016-08-09 LAB — URINE CULTURE: Culture: >=100000 — AB

## 2016-08-10 ENCOUNTER — Ambulatory Visit (INDEPENDENT_AMBULATORY_CARE_PROVIDER_SITE_OTHER): Payer: Managed Care, Other (non HMO) | Admitting: Internal Medicine

## 2016-08-10 ENCOUNTER — Encounter: Payer: Self-pay | Admitting: Internal Medicine

## 2016-08-10 VITALS — BP 120/76 | HR 66 | Resp 16 | Ht 62.0 in | Wt 135.0 lb

## 2016-08-10 DIAGNOSIS — N3000 Acute cystitis without hematuria: Secondary | ICD-10-CM

## 2016-08-10 DIAGNOSIS — I10 Essential (primary) hypertension: Secondary | ICD-10-CM

## 2016-08-10 DIAGNOSIS — S93601S Unspecified sprain of right foot, sequela: Secondary | ICD-10-CM | POA: Diagnosis not present

## 2016-08-10 NOTE — Progress Notes (Signed)
Date:  08/10/2016   Name:  Darlene Newman   DOB:  [birth year]   MRN:  [id number]   Chief Complaint: Loss of Consciousness (Hosp F/U ER visit. Passed out and ijured foot. ) and Foot Injury (Right foot injury after fainting spell with dehydration. Will see Podiatry. )  Loss of Consciousness  This is a new problem. Episode onset: three days ago. Episode frequency: once. The problem has been resolved. She lost consciousness for a period of less than 1 minute. Exacerbated by: jumping out of bed with leg cramps. Pertinent negatives include no abdominal pain, chest pain, fever or palpitations.  Foot Injury   Incident onset: three days. The incident occurred at home. The injury mechanism was a fall. The pain is present in the right foot. The pain is at a severity of 4/10. The pain is moderate. She has tried elevation, NSAIDs and non-weight bearing for the symptoms.  Urinary Tract Infection   This is a new problem. There has been no fever. Pertinent negatives include no chills, frequency, hematuria or urgency. Treatments tried: got Keflex from ER.  Her foot is still very painful and swollen.  Unable to bear weight - using ACE wrap and crutches.  She is seeing Podiatry tomorrow.  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  Urine culture reviewed - >100,000 Klebsiella sens to Cephalosporins  Review of Systems  Constitutional: Negative for chills, fatigue and fever.  Respiratory: Negative for cough, chest tightness and shortness of breath.   Cardiovascular: Positive for leg swelling and syncope. Negative for chest pain and palpitations.  Gastrointestinal: Negative for abdominal pain.  Genitourinary: Negative for dysuria, frequency, hematuria and urgency.  Musculoskeletal: Positive for arthralgias and gait problem.  Skin: Positive for color change. Negative for rash.  Hematological: Negative for adenopathy.    Patient Active Problem List   Diagnosis Date  Noted  . Impingement syndrome of right shoulder 05/20/2015  . Hypertriglyceridemia 02/17/2015  . Benign hypertension 02/17/2015  . Climacteric 02/17/2015  . Cervical pain 02/17/2015  . History of fundoplication 02/17/2015  . Tobacco use disorder, mild, in sustained remission 02/17/2015  . Calcification of coronary artery 12/25/2012  . Dermatitis contusiformis 05/31/2012  . Acid reflux 05/24/2012  . Lung mass 08/31/2011    Prior to Admission medications   Medication Sig Start Date End Date Taking? Authorizing Provider  albuterol (PROVENTIL HFA;VENTOLIN HFA) 108 (90 Base) MCG/ACT inhaler Inhale into the lungs. 02/02/15  Yes Historical Provider, MD  cephALEXin (KEFLEX) 500 MG capsule Take 1 capsule (500 mg total) by mouth 3 (three) times daily. 08/07/16 08/17/16 Yes Jeanmarie Plant, MD  dicyclomine (BENTYL) 10 MG capsule Take 1 capsule (10 mg total) by mouth 4 (four) times daily -  before meals and at bedtime. 12/13/15  Yes Reubin Milan, MD  levothyroxine (SYNTHROID, LEVOTHROID) 50 MCG tablet Take 1 tablet (50 mcg total) by mouth daily. 06/09/16  Yes Reubin Milan, MD  losartan (COZAAR) 50 MG tablet Take by mouth.   Yes Historical Provider, MD  metoprolol (LOPRESSOR) 50 MG tablet Take by mouth. 05/21/14  Yes Historical Provider, MD  omeprazole (PRILOSEC) 20 MG capsule TAKE 1 CAPSULE BY MOUTH ONCE DAILY. 12/27/15  Yes Reubin Milan, MD  PARoxetine (PAXIL) 20 MG tablet TAKE 1 TABLET BY MOUTH ONCE DAILY. 04/24/16  Yes Reubin Milan, MD  simvastatin (ZOCOR) 40 MG tablet TAKE 1 TABLET BY MOUTH ONCE DAILY. 05/05/16  Yes  Reubin MilanLaura H Rathana Viveros, MD  traMADol (ULTRAM) 50 MG tablet Take 1 tablet (50 mg total) by mouth every 6 (six) hours as needed. 08/07/16 08/07/17 Yes Jeanmarie PlantJames A McShane, MD    No Known Allergies  Past Surgical History:  Procedure Laterality Date  . HIATAL HERNIA REPAIR  2015  . TOTAL VAGINAL HYSTERECTOMY  2012    Social History  Substance Use Topics  . Smoking status: Former  Smoker    Quit date: 10/30/2006  . Smokeless tobacco: Never Used  . Alcohol use No     Medication list has been reviewed and updated.   Physical Exam  Constitutional: She is oriented to person, place, and time. She appears well-developed. No distress.  HENT:  Head: Normocephalic and atraumatic.  Neck: Normal range of motion. Neck supple.  Cardiovascular: Normal rate, regular rhythm and normal heart sounds.   Pulmonary/Chest: Effort normal and breath sounds normal. No respiratory distress.  Abdominal: Soft. Bowel sounds are normal. There is no tenderness. There is no rebound.  Musculoskeletal: Normal range of motion. She exhibits edema and tenderness.       Feet:  Neurological: She is alert and oriented to person, place, and time.  Skin: Skin is warm and dry. No rash noted.  Psychiatric: She has a normal mood and affect. Her speech is normal and behavior is normal. Thought content normal.  Nursing note and vitals reviewed.   BP 120/76   Pulse 66   Resp 16   Ht 5\' 2"  (1.575 m)   Wt 135 lb (61.2 kg)   SpO2 98%   BMI 24.69 kg/m   Assessment and Plan: 1. Foot sprain, right, sequela Continue elevation, ace wraps and follow up with Podiatry  2. Acute cystitis without hematuria Take all antibiotics Increase fluid intake  3. Benign hypertension controlled   Bari EdwardLaura Encarnacion Scioneaux, MD [facility]  08/10/2016

## 2016-08-30 ENCOUNTER — Other Ambulatory Visit: Payer: Self-pay | Admitting: Internal Medicine

## 2016-09-02 ENCOUNTER — Other Ambulatory Visit: Payer: Self-pay | Admitting: Internal Medicine

## 2016-09-30 ENCOUNTER — Other Ambulatory Visit: Payer: Self-pay | Admitting: Internal Medicine

## 2016-10-13 ENCOUNTER — Encounter: Payer: Self-pay | Admitting: Internal Medicine

## 2016-11-07 ENCOUNTER — Other Ambulatory Visit: Payer: Self-pay | Admitting: Internal Medicine

## 2016-11-07 NOTE — Telephone Encounter (Signed)
Left multiple msgs for pt to call the office, no return calls °

## 2016-11-08 ENCOUNTER — Other Ambulatory Visit: Payer: Self-pay | Admitting: Internal Medicine

## 2016-11-08 NOTE — Telephone Encounter (Signed)
Called pt back as her last time she took her thyroid medication and pt stated 1 tab for tomorrow....need refill until the appointment to see Dr. Judithann GravesBerglund

## 2016-11-08 NOTE — Telephone Encounter (Signed)
Called pt multiple time but no answer--left VM message to call the office back regarding about her medication request.

## 2016-11-24 ENCOUNTER — Ambulatory Visit (INDEPENDENT_AMBULATORY_CARE_PROVIDER_SITE_OTHER): Payer: Commercial Managed Care - PPO | Admitting: Internal Medicine

## 2016-11-24 ENCOUNTER — Other Ambulatory Visit
Admission: RE | Admit: 2016-11-24 | Discharge: 2016-11-24 | Disposition: A | Discharge status: Home / Self Care | Payer: Commercial Managed Care - PPO | Source: Ambulatory Visit | Attending: Internal Medicine | Admitting: Internal Medicine

## 2016-11-24 ENCOUNTER — Encounter: Payer: Self-pay | Admitting: Internal Medicine

## 2016-11-24 VITALS — BP 126/86 | HR 57 | Temp 97.6°F | Ht 63.0 in | Wt 143.0 lb

## 2016-11-24 DIAGNOSIS — N289 Disorder of kidney and ureter, unspecified: Secondary | ICD-10-CM | POA: Diagnosis not present

## 2016-11-24 DIAGNOSIS — E034 Atrophy of thyroid (acquired): Secondary | ICD-10-CM | POA: Diagnosis not present

## 2016-11-24 DIAGNOSIS — E039 Hypothyroidism, unspecified: Secondary | ICD-10-CM | POA: Insufficient documentation

## 2016-11-24 DIAGNOSIS — I10 Essential (primary) hypertension: Secondary | ICD-10-CM

## 2016-11-24 DIAGNOSIS — K219 Gastro-esophageal reflux disease without esophagitis: Secondary | ICD-10-CM

## 2016-11-24 LAB — BASIC METABOLIC PANEL
Anion gap: 8 (ref 5–15)
BUN: 18 mg/dL (ref 6–20)
CHLORIDE: 102 mmol/L (ref 101–111)
CO2: 29 mmol/L (ref 22–32)
CREATININE: 0.84 mg/dL (ref 0.44–1.00)
Calcium: 9.1 mg/dL (ref 8.9–10.3)
GFR calc Af Amer: >60 mL/min (ref >60)
GFR calc non Af Amer: >60 mL/min (ref >60)
Glucose, Bld: 106 mg/dL — ABNORMAL HIGH (ref 65–99)
POTASSIUM: 3.5 mmol/L (ref 3.5–5.1)
Sodium: 139 mmol/L (ref 135–145)

## 2016-11-24 LAB — TSH: TSH: 3.032 u[IU]/mL (ref 0.350–4.500)

## 2016-11-24 LAB — T4, FREE: FREE T4: 0.86 ng/dL (ref 0.61–1.12)

## 2016-11-24 MED ORDER — LOSARTAN POTASSIUM 50 MG PO TABS: 50.0000 mg | ORAL_TABLET | Freq: Every day | ORAL | 5 refills | Status: DC

## 2016-11-24 MED ORDER — METOPROLOL TARTRATE 50 MG PO TABS: 50.0000 mg | ORAL_TABLET | Freq: Two times a day (BID) | ORAL | 5 refills | Status: DC

## 2016-11-24 MED ORDER — LEVOTHYROXINE SODIUM 50 MCG PO TABS: 50.0000 ug | ORAL_TABLET | Freq: Every day | ORAL | 5 refills | Status: DC

## 2016-11-24 NOTE — Progress Notes (Signed)
Date:  11/24/2016   Name:  Darlene LaosCarol Renea Newman   DOB:  02/03/1961   MRN:  [id number]   Chief Complaint: Thyroid Problem and Hypertension Thyroid Problem  Presents for follow-up visit. Patient reports no anxiety, constipation, depressed mood, fatigue, leg swelling, palpitations or weight gain. The symptoms have been improving (medication started last visit).  Hypertension  This is a chronic problem. The problem is controlled. Pertinent negatives include no chest pain, headaches, palpitations or shortness of breath. Past treatments include angiotensin blockers and beta blockers. Hypertensive end-organ damage includes kidney disease. Identifiable causes of hypertension include a thyroid problem.  Renal insufficiency - mild decrease over the past 4 months.  Pt does not take advil or aleve. She sometimes takes tramadol.  Wt Readings from Last 3 Encounters:  11/24/16 143 lb (64.9 kg)  08/10/16 135 lb (61.2 kg)  08/07/16 135 lb (61.2 kg)   Lab Results  Component Value Date   CREATININE 1.28 (H) 08/07/2016     Review of Systems  Constitutional: Negative for chills, fatigue, fever and weight gain.  HENT: Negative for congestion, sinus pressure, trouble swallowing and voice change.   Eyes: Negative for visual disturbance.  Respiratory: Negative for cough, chest tightness and shortness of breath.   Cardiovascular: Negative for chest pain, palpitations and leg swelling.  Gastrointestinal: Negative for abdominal pain, blood in stool and constipation.  Endocrine: Negative for polyuria.  Genitourinary: Negative for difficulty urinating, frequency and hematuria.  Musculoskeletal: Negative for arthralgias.  Skin: Negative for color change and rash.  Neurological: Negative for dizziness and headaches.  Hematological: Negative for adenopathy.  Psychiatric/Behavioral: Negative for dysphoric mood, sleep disturbance and suicidal ideas. The patient is not nervous/anxious.     Patient Active Problem  List   Diagnosis Date Noted  . Hypothyroidism due to acquired atrophy of thyroid 11/24/2016  . Renal insufficiency 11/24/2016  . Impingement syndrome of right shoulder 05/20/2015  . Hypertriglyceridemia 02/17/2015  . Benign hypertension 02/17/2015  . Climacteric 02/17/2015  . Cervical pain 02/17/2015  . History of fundoplication 02/17/2015  . Tobacco use disorder, mild, in sustained remission 02/17/2015  . Calcification of coronary artery 12/25/2012  . Dermatitis contusiformis 05/31/2012  . Acid reflux 05/24/2012  . Lung mass 08/31/2011    Prior to Admission medications   Medication Sig Start Date End Date Taking? Authorizing Provider  albuterol (PROVENTIL HFA;VENTOLIN HFA) 108 (90 Base) MCG/ACT inhaler Inhale into the lungs. 02/02/15  Yes Historical Provider, MD  dicyclomine (BENTYL) 10 MG capsule Take 1 capsule (10 mg total) by mouth 4 (four) times daily -  before meals and at bedtime. 12/13/15  Yes Reubin MilanLaura H Maranatha Grossi, MD  levothyroxine (SYNTHROID, LEVOTHROID) 50 MCG tablet TAKE 1 TABLET(50 MCG) BY MOUTH DAILY 11/07/16  Yes Reubin MilanLaura H Ura Hausen, MD  losartan (COZAAR) 50 MG tablet Take by mouth.   Yes Historical Provider, MD  metoprolol (LOPRESSOR) 50 MG tablet Take by mouth. 05/21/14  Yes Historical Provider, MD  omeprazole (PRILOSEC) 20 MG capsule TAKE 1 CAPSULE BY MOUTH ONCE DAILY. 12/27/15  Yes Reubin MilanLaura H Eidan Muellner, MD  PARoxetine (PAXIL) 20 MG tablet TAKE 1 TABLET BY MOUTH ONCE DAILY. 08/30/16  Yes Reubin MilanLaura H Theoplis Garciagarcia, MD  simvastatin (ZOCOR) 40 MG tablet TAKE 1 TABLET BY MOUTH ONCE DAILY. 09/02/16  Yes Reubin MilanLaura H Manjot Hinks, MD  traMADol (ULTRAM) 50 MG tablet Take 1 tablet (50 mg total) by mouth every 6 (six) hours as needed. Patient not taking: Reported on 11/24/2016 08/07/16 08/07/17  Jeanmarie PlantJames A McShane, MD  No Known Allergies  Past Surgical History:  Procedure Laterality Date  . HIATAL HERNIA REPAIR  2015  . TOTAL VAGINAL HYSTERECTOMY  2012    Social History  Substance Use Topics  . Smoking  status: Former Smoker    Quit date: 10/30/2006  . Smokeless tobacco: Never Used  . Alcohol use No     Medication list has been reviewed and updated.   Physical Exam  Constitutional: She is oriented to person, place, and time. She appears well-developed. No distress.  HENT:  Head: Normocephalic and atraumatic.  Cardiovascular: Normal rate, regular rhythm and normal heart sounds.   Pulmonary/Chest: Effort normal and breath sounds normal. No respiratory distress. She has no wheezes.  Musculoskeletal: She exhibits no edema or tenderness.  Neurological: She is alert and oriented to person, place, and time.  Skin: Skin is warm and dry. No rash noted.  Psychiatric: She has a normal mood and affect. Her behavior is normal. Thought content normal.  Nursing note and vitals reviewed.   BP 126/86   Pulse (!) 57   Temp 97.6 F (36.4 C)   Ht 5\' 3"  (1.6 m)   Wt 143 lb (64.9 kg)   SpO2 99%   BMI 25.33 kg/m   Assessment and Plan: 1. Benign hypertension controlled - losartan (COZAAR) 50 MG tablet; Take 1 tablet (50 mg total) by mouth daily.  Dispense: 30 tablet; Refill: 5 - metoprolol (LOPRESSOR) 50 MG tablet; Take 1 tablet (50 mg total) by mouth 2 (two) times daily.  Dispense: 60 tablet; Refill: 5  2. Hypothyroidism due to acquired atrophy of thyroid Now on supplement - levothyroxine (SYNTHROID, LEVOTHROID) 50 MCG tablet; Take 1 tablet (50 mcg total) by mouth daily before breakfast.  Dispense: 30 tablet; Refill: 5  3. Gastroesophageal reflux disease without esophagitis Controlled on medication  4. Renal insufficiency Continue to monitor - pt advised not to take nsaids; only tylenol for pain   Bari Edward, MD [facility]  11/24/2016

## 2016-11-24 NOTE — Patient Instructions (Signed)
Do not take Advil, ibuprofen, Aleve or naproxen (or any prescription medications of a similar type called NSAIDS)  Tylenol or Acetaminophen is fine, so is tramadol

## 2016-11-25 LAB — T3, FREE: T3, Free: 2.9 pg/mL (ref 2.0–4.4)

## 2016-12-09 ENCOUNTER — Other Ambulatory Visit: Payer: Self-pay | Admitting: Internal Medicine

## 2017-01-04 ENCOUNTER — Other Ambulatory Visit: Payer: Self-pay | Admitting: Internal Medicine

## 2017-01-07 ENCOUNTER — Other Ambulatory Visit: Payer: Self-pay | Admitting: Internal Medicine

## 2017-01-07 DIAGNOSIS — I10 Essential (primary) hypertension: Secondary | ICD-10-CM

## 2017-01-12 ENCOUNTER — Other Ambulatory Visit: Payer: Self-pay | Admitting: Internal Medicine

## 2017-01-22 ENCOUNTER — Encounter: Payer: Self-pay | Admitting: Emergency Medicine

## 2017-01-22 ENCOUNTER — Emergency Department
Admission: EM | Admit: 2017-01-22 | Discharge: 2017-01-22 | Disposition: A | Discharge status: Home / Self Care | Payer: Commercial Managed Care - PPO | Attending: Emergency Medicine | Admitting: Emergency Medicine

## 2017-01-22 DIAGNOSIS — L03116 Cellulitis of left lower limb: Secondary | ICD-10-CM

## 2017-01-22 DIAGNOSIS — I10 Essential (primary) hypertension: Secondary | ICD-10-CM | POA: Insufficient documentation

## 2017-01-22 DIAGNOSIS — M7989 Other specified soft tissue disorders: Secondary | ICD-10-CM | POA: Diagnosis present

## 2017-01-22 DIAGNOSIS — Z87891 Personal history of nicotine dependence: Secondary | ICD-10-CM | POA: Diagnosis not present

## 2017-01-22 DIAGNOSIS — E039 Hypothyroidism, unspecified: Secondary | ICD-10-CM | POA: Insufficient documentation

## 2017-01-22 DIAGNOSIS — I2510 Atherosclerotic heart disease of native coronary artery without angina pectoris: Secondary | ICD-10-CM | POA: Diagnosis not present

## 2017-01-22 MED ORDER — CEPHALEXIN 500 MG PO CAPS: 500.0000 mg | ORAL_CAPSULE | Freq: Three times a day (TID) | ORAL | 0 refills | Status: DC

## 2017-01-22 MED ORDER — HYDROCODONE-ACETAMINOPHEN 5-325 MG PO TABS
1.0000 | ORAL_TABLET | Freq: Once | ORAL | Status: AC
Start: 2017-01-22 — End: 2017-01-22
  Administered 2017-01-22: 1 via ORAL
  Filled 2017-01-22: qty 1

## 2017-01-22 MED ORDER — HYDROCODONE-ACETAMINOPHEN 5-325 MG PO TABS: 1.0000 | ORAL_TABLET | Freq: Four times a day (QID) | ORAL | 0 refills | Status: DC | PRN

## 2017-01-22 MED ORDER — CEPHALEXIN 500 MG PO CAPS
500.0000 mg | ORAL_CAPSULE | Freq: Once | ORAL | Status: AC
  Administered 2017-01-22: 500 mg via ORAL
  Filled 2017-01-22: qty 1

## 2017-01-22 NOTE — ED Triage Notes (Signed)
PT presents for rash on left ankle that has increased in size and is now painful. PT has history of skin " bumps" that resolve on their own. PT ambulatory to triage and NAD at this time.

## 2017-01-22 NOTE — ED Provider Notes (Signed)
[facility] Emergency Department Provider Note   ____________________________________________   First MD Initiated Contact with Patient 01/22/17 [phone]     (approximate)  I have reviewed the triage vital signs and the nursing notes.   HISTORY  Chief Complaint Leg Swelling    HPI Darlene Newman is a 56 y.o. female who presents to the ED from work with a chief complaint of left ankle pain, swelling and rash.She reports a 3 week history of progressive swelling and rash to her left lateral ankle. As far she knows, she was not bitten or stung by insects. Denies fall/injury/trauma. Does not wear boots or compressive foot wear for work at the [employer]. Denies associated fever, chills, chest pain, shortness of breath, abdominal pain, nausea, vomiting, diarrhea. Denies unusual bruising or bleeding. Nothing makes her symptoms better or worse.   Past Medical History:  Diagnosis Date  . Acute cystitis   . CAD (coronary artery disease)   . Chronic right shoulder pain   . Depression   . GERD (gastroesophageal reflux disease)   . Hyperlipidemia   . Hypertension   . Lung mass     Patient Active Problem List   Diagnosis Date Noted  . Hypothyroidism due to acquired atrophy of thyroid 11/24/2016  . Renal insufficiency 11/24/2016  . Impingement syndrome of right shoulder 05/20/2015  . Hypertriglyceridemia 02/17/2015  . Benign hypertension 02/17/2015  . Climacteric 02/17/2015  . Cervical pain 02/17/2015  . History of fundoplication 02/17/2015  . Tobacco use disorder, mild, in sustained remission 02/17/2015  . Calcification of coronary artery 12/25/2012  . Dermatitis contusiformis 05/31/2012  . Acid reflux 05/24/2012  . Lung mass 08/31/2011    Past Surgical History:  Procedure Laterality Date  . HIATAL HERNIA REPAIR  2015  . TOTAL VAGINAL HYSTERECTOMY  2012    Prior to Admission medications   Medication Sig Start Date End Date Taking? Authorizing  Provider  albuterol (PROVENTIL HFA;VENTOLIN HFA) 108 (90 Base) MCG/ACT inhaler Inhale into the lungs. 02/02/15   Historical Provider, MD  cephALEXin (KEFLEX) 500 MG capsule Take 1 capsule (500 mg total) by mouth 3 (three) times daily. 01/22/17   Irean Hong, MD  dicyclomine (BENTYL) 10 MG capsule TAKE 1 CAPSULE(10 MG) BY MOUTH FOUR TIMES DAILY BEFORE MEALS AND AT BEDTIME 01/12/17   Reubin Milan, MD  HYDROcodone-acetaminophen (NORCO) 5-325 MG tablet Take 1 tablet by mouth every 6 (six) hours as needed for moderate pain. 01/22/17   Irean Hong, MD  levothyroxine (SYNTHROID, LEVOTHROID) 50 MCG tablet Take 1 tablet (50 mcg total) by mouth daily before breakfast. 11/24/16   Reubin Milan, MD  levothyroxine (SYNTHROID, LEVOTHROID) 50 MCG tablet TAKE 1 TABLET(50 MCG) BY MOUTH DAILY 12/10/16   Reubin Milan, MD  losartan (COZAAR) 50 MG tablet TAKE 1 TABLET BY MOUTH EVERY DAY 01/07/17   Reubin Milan, MD  metoprolol (LOPRESSOR) 50 MG tablet TAKE 1 TABLET BY MOUTH TWICE DAILY 01/07/17   Reubin Milan, MD  omeprazole (PRILOSEC) 20 MG capsule TAKE 1 CAPSULE BY MOUTH ONCE DAILY. 01/04/17   Reubin Milan, MD  PARoxetine (PAXIL) 20 MG tablet TAKE 1 TABLET BY MOUTH ONCE DAILY. 08/30/16   Reubin Milan, MD  simvastatin (ZOCOR) 40 MG tablet TAKE 1 TABLET BY MOUTH ONCE DAILY. 09/02/16   Reubin Milan, MD  traMADol (ULTRAM) 50 MG tablet Take 1 tablet (50 mg total) by mouth every 6 (six) hours as needed. Patient not taking: Reported  on 11/24/2016 08/07/16 08/07/17  Jeanmarie PlantJames A McShane, MD    Allergies Ibuprofen  Family History  Problem Relation Age of Onset  . Diabetes Mother   . Heart disease Mother   . Diabetes Sister     Social History Social History  Substance Use Topics  . Smoking status: Former Smoker    Quit date: 10/30/2006  . Smokeless tobacco: Never Used  . Alcohol use No    Review of Systems  Constitutional: No fever/chills. Eyes: No visual changes. ENT: No sore  throat. Cardiovascular: Denies chest pain. Respiratory: Denies shortness of breath. Gastrointestinal: No abdominal pain.  No nausea, no vomiting.  No diarrhea.  No constipation. Genitourinary: Negative for dysuria. Musculoskeletal: Negative for back pain. Skin: Positive for rash. Neurological: Negative for headaches, focal weakness or numbness.  10-point ROS otherwise negative.  ____________________________________________   PHYSICAL EXAM:  VITAL SIGNS: ED Triage Vitals  Enc Vitals Group     BP 01/22/17 0610 (!) 149/73     Pulse Rate 01/22/17 0610 62     Resp 01/22/17 0610 18     Temp 01/22/17 0610 98.3 F (36.8 C)     Temp Source 01/22/17 0610 Oral     SpO2 01/22/17 0610 100 %     Weight 01/22/17 0611 135 lb (61.2 kg)     Height 01/22/17 0611 5' 2.5" (1.588 m)     Head Circumference --      Peak Flow --      Pain Score 01/22/17 0611 4     Pain Loc --      Pain Edu? --      Excl. in GC? --     Constitutional: Alert and oriented. Well appearing and in no acute distress. Eyes: Conjunctivae are normal. PERRL. EOMI. Head: Atraumatic. Nose: No congestion/rhinnorhea. Mouth/Throat: Mucous membranes are moist.  Oropharynx non-erythematous. Neck: No stridor.   Cardiovascular: Normal rate, regular rhythm. Grossly normal heart sounds.  Good peripheral circulation. Respiratory: Normal respiratory effort.  No retractions. Lungs CTAB. Gastrointestinal: Soft and nontender. No distention. No abdominal bruits. No CVA tenderness. Musculoskeletal:  Left lateral ankle with approximately 4 cm diameter area of erythema, slight warmth. Lateral malleolus mildly swollen. Full range of motion without pain. Neurologic:  Normal speech and language. No gross focal neurologic deficits are appreciated. No gait instability. Skin:  Skin is warm, dry and intact. No rash noted. Psychiatric: Mood and affect are normal. Speech and behavior are normal.  ____________________________________________    LABS (all labs ordered are listed, but only abnormal results are displayed)  Labs Reviewed - No data to display ____________________________________________  EKG  None ____________________________________________  RADIOLOGY  None ____________________________________________   PROCEDURES  Procedure(s) performed: None  Procedures  Critical Care performed: No  ____________________________________________   INITIAL IMPRESSION / ASSESSMENT AND PLAN / ED COURSE  Pertinent labs & imaging results that were available during my care of the patient were reviewed by me and considered in my medical decision making (see chart for details).  56 year old female who presents with redness and warmth to her left lateral malleolus concerning for cellulitis. Will place on Keflex and patient will follow-up with PCP this week. Strict return precautions given. Patient verbalizes understanding and agrees with plan of care.      ____________________________________________   FINAL CLINICAL IMPRESSION(S) / ED DIAGNOSES  Final diagnoses:  Cellulitis of left lower extremity      NEW MEDICATIONS STARTED DURING THIS VISIT:  New Prescriptions   CEPHALEXIN (KEFLEX) 500 MG CAPSULE  Take 1 capsule (500 mg total) by mouth 3 (three) times daily.   HYDROCODONE-ACETAMINOPHEN (NORCO) 5-325 MG TABLET    Take 1 tablet by mouth every 6 (six) hours as needed for moderate pain.     Note:  This document was prepared using Dragon voice recognition software and may include unintentional dictation errors.    Irean Hong, MD 01/22/17 [phone]

## 2017-01-22 NOTE — Discharge Instructions (Signed)
1. Take antibiotic as prescribed (Keflex 500 mg 3 times daily 7 days). 2. Elevate affected area several times daily to reduce swelling. 3. You may take Norco as needed for pain. 4. Return to the ER for worsening symptoms, persistent vomiting, difficulty breathing or other concerns.

## 2017-01-25 ENCOUNTER — Ambulatory Visit (INDEPENDENT_AMBULATORY_CARE_PROVIDER_SITE_OTHER): Payer: Commercial Managed Care - PPO | Admitting: Internal Medicine

## 2017-01-25 ENCOUNTER — Encounter: Payer: Self-pay | Admitting: Internal Medicine

## 2017-01-25 VITALS — BP 112/64 | HR 60 | Ht 63.0 in | Wt 140.0 lb

## 2017-01-25 DIAGNOSIS — L03116 Cellulitis of left lower limb: Secondary | ICD-10-CM

## 2017-01-25 MED ORDER — CEPHALEXIN 500 MG PO CAPS: 500.0000 mg | ORAL_CAPSULE | Freq: Three times a day (TID) | ORAL | 0 refills | Status: DC

## 2017-01-25 NOTE — Progress Notes (Signed)
Date:  01/25/2017   Name:  Darlene Newman   DOB:  10/09/1961   MRN:  [id number]   Chief Complaint: Cellulitis (Around left ankle. Red, inflammed and slightly swollen. Gotten better since taking antibiotic from ER-  Keflex. But still not going away. Started 3 weeks ago as a little knot on ankle, and then got bigger and more red. ) HPI Seen 4 days ago in UC and given Keflex for cellulitis.  The area is much less red but still tender.  She has continued to work a job that has her on her feet all shift.  She is tolerating the Keflex.  She has hydrocodone for night. She does not recall any injury, bite or other incident that might have caused this.    Review of Systems  Constitutional: Negative for chills, fatigue and fever.  Respiratory: Negative for chest tightness and shortness of breath.   Cardiovascular: Negative for chest pain.  Musculoskeletal: Negative for arthralgias.  Skin: Positive for color change and rash.    Patient Active Problem List   Diagnosis Date Noted  . Hypothyroidism due to acquired atrophy of thyroid 11/24/2016  . Renal insufficiency 11/24/2016  . Impingement syndrome of right shoulder 05/20/2015  . Hypertriglyceridemia 02/17/2015  . Benign hypertension 02/17/2015  . Climacteric 02/17/2015  . Cervical pain 02/17/2015  . History of fundoplication 02/17/2015  . Tobacco use disorder, mild, in sustained remission 02/17/2015  . Calcification of coronary artery 12/25/2012  . Dermatitis contusiformis 05/31/2012  . Acid reflux 05/24/2012  . Lung mass 08/31/2011    Prior to Admission medications   Medication Sig Start Date End Date Taking? Authorizing Provider  albuterol (PROVENTIL HFA;VENTOLIN HFA) 108 (90 Base) MCG/ACT inhaler Inhale into the lungs. 02/02/15  Yes Historical Provider, MD  cephALEXin (KEFLEX) 500 MG capsule Take 1 capsule (500 mg total) by mouth 3 (three) times daily. 01/22/17  Yes Irean Hong, MD  dicyclomine (BENTYL) 10 MG capsule TAKE 1  CAPSULE(10 MG) BY MOUTH FOUR TIMES DAILY BEFORE MEALS AND AT BEDTIME 01/12/17  Yes Reubin Milan, MD  HYDROcodone-acetaminophen (NORCO) 5-325 MG tablet Take 1 tablet by mouth every 6 (six) hours as needed for moderate pain. 01/22/17  Yes Irean Hong, MD  levothyroxine (SYNTHROID, LEVOTHROID) 50 MCG tablet Take 1 tablet (50 mcg total) by mouth daily before breakfast. 11/24/16  Yes Reubin Milan, MD  losartan (COZAAR) 50 MG tablet TAKE 1 TABLET BY MOUTH EVERY DAY 01/07/17  Yes Reubin Milan, MD  metoprolol (LOPRESSOR) 50 MG tablet TAKE 1 TABLET BY MOUTH TWICE DAILY 01/07/17  Yes Reubin Milan, MD  omeprazole (PRILOSEC) 20 MG capsule TAKE 1 CAPSULE BY MOUTH ONCE DAILY. 01/04/17  Yes Reubin Milan, MD  PARoxetine (PAXIL) 20 MG tablet TAKE 1 TABLET BY MOUTH ONCE DAILY. 08/30/16  Yes Reubin Milan, MD  simvastatin (ZOCOR) 40 MG tablet TAKE 1 TABLET BY MOUTH ONCE DAILY. 09/02/16  Yes Reubin Milan, MD    Allergies  Allergen Reactions  . Ibuprofen     - kidney function low so don't take no more.     Past Surgical History:  Procedure Laterality Date  . HIATAL HERNIA REPAIR  2015  . TOTAL VAGINAL HYSTERECTOMY  2012    Social History  Substance Use Topics  . Smoking status: Former Smoker    Quit date: 10/30/2006  . Smokeless tobacco: Never Used  . Alcohol use No     Medication list has been reviewed and  updated.   Physical Exam  Constitutional: She is oriented to person, place, and time. She appears well-developed. No distress.  HENT:  Head: Normocephalic and atraumatic.  Neck: Normal range of motion. Neck supple.  Cardiovascular: Normal rate, regular rhythm and normal heart sounds.   Pulmonary/Chest: Effort normal. No respiratory distress.  Musculoskeletal: She exhibits edema.  Neurological: She is alert and oriented to person, place, and time.  Skin: Skin is warm and dry. No rash noted. There is erythema.     Erythema 3 cm size - no fluctuance or drainage Mild swelling  of the distal left leg and foot  Psychiatric: She has a normal mood and affect. Her behavior is normal. Thought content normal.  Nursing note and vitals reviewed.   BP 112/64 (BP Location: Right Arm, Patient Position: Sitting, Cuff Size: Normal)   Pulse 60   Ht 5\' 3"  (1.6 m)   Wt 140 lb (63.5 kg)   SpO2 98%   BMI 24.80 kg/m   Assessment and Plan: 1. Cellulitis of left lower extremity Finish one week of Keflex - an additional Rx given if sx persist Elevate, take tylenol during the day for discomfort   Meds ordered this encounter  Medications  . cephALEXin (KEFLEX) 500 MG capsule    Sig: Take 1 capsule (500 mg total) by mouth 3 (three) times daily.    Dispense:  21 capsule    Refill:  0    Bari EdwardLaura Chaitanya Amedee, MD [facility]  01/25/2017

## 2017-04-23 ENCOUNTER — Other Ambulatory Visit: Payer: Self-pay | Admitting: Internal Medicine

## 2017-06-07 ENCOUNTER — Other Ambulatory Visit: Payer: Self-pay | Admitting: Internal Medicine

## 2017-06-07 DIAGNOSIS — I10 Essential (primary) hypertension: Secondary | ICD-10-CM

## 2017-06-12 ENCOUNTER — Encounter: Payer: Self-pay | Admitting: Internal Medicine

## 2017-06-12 DIAGNOSIS — M5030 Other cervical disc degeneration, unspecified cervical region: Secondary | ICD-10-CM | POA: Insufficient documentation

## 2017-06-17 ENCOUNTER — Other Ambulatory Visit: Payer: Self-pay | Admitting: Internal Medicine

## 2017-06-18 NOTE — Telephone Encounter (Signed)
Called pt and home phone not in service but I got an updated number from her daughter and left a message. Home [phone].

## 2017-06-18 NOTE — Telephone Encounter (Signed)
Put the new number in her demographics and continue to call her.

## 2017-06-19 NOTE — Telephone Encounter (Signed)
Pt set up to come in on 06/29/17

## 2017-06-29 ENCOUNTER — Ambulatory Visit: Payer: Self-pay | Admitting: Internal Medicine

## 2017-07-04 ENCOUNTER — Ambulatory Visit (INDEPENDENT_AMBULATORY_CARE_PROVIDER_SITE_OTHER): Payer: Commercial Managed Care - PPO | Admitting: Internal Medicine

## 2017-07-04 ENCOUNTER — Encounter: Payer: Self-pay | Admitting: Internal Medicine

## 2017-07-04 ENCOUNTER — Other Ambulatory Visit: Payer: Self-pay | Admitting: Internal Medicine

## 2017-07-04 VITALS — BP 112/68 | HR 58 | Ht 63.0 in | Wt 141.0 lb

## 2017-07-04 DIAGNOSIS — Z1231 Encounter for screening mammogram for malignant neoplasm of breast: Secondary | ICD-10-CM

## 2017-07-04 DIAGNOSIS — K219 Gastro-esophageal reflux disease without esophagitis: Secondary | ICD-10-CM | POA: Diagnosis not present

## 2017-07-04 DIAGNOSIS — E034 Atrophy of thyroid (acquired): Secondary | ICD-10-CM

## 2017-07-04 DIAGNOSIS — I10 Essential (primary) hypertension: Secondary | ICD-10-CM

## 2017-07-04 DIAGNOSIS — E781 Pure hyperglyceridemia: Secondary | ICD-10-CM

## 2017-07-04 DIAGNOSIS — Z1239 Encounter for other screening for malignant neoplasm of breast: Secondary | ICD-10-CM

## 2017-07-04 MED ORDER — SIMVASTATIN 40 MG PO TABS: 40.0000 mg | ORAL_TABLET | Freq: Every day | ORAL | 5 refills | Status: DC

## 2017-07-04 MED ORDER — METOPROLOL TARTRATE 50 MG PO TABS: 50.0000 mg | ORAL_TABLET | Freq: Two times a day (BID) | ORAL | 5 refills | Status: DC

## 2017-07-04 MED ORDER — OMEPRAZOLE 20 MG PO CPDR
20.0000 mg | DELAYED_RELEASE_CAPSULE | Freq: Every day | ORAL | 5 refills | Status: DC
Start: 2017-07-04 — End: 2018-01-01

## 2017-07-04 MED ORDER — LEVOTHYROXINE SODIUM 50 MCG PO TABS: 50.0000 ug | ORAL_TABLET | Freq: Every day | ORAL | 5 refills | Status: DC

## 2017-07-04 MED ORDER — LOSARTAN POTASSIUM 50 MG PO TABS: 50.0000 mg | ORAL_TABLET | Freq: Every day | ORAL | 5 refills | Status: DC

## 2017-07-04 NOTE — Patient Instructions (Signed)

## 2017-07-04 NOTE — Progress Notes (Signed)
Date:  07/04/2017   Name:  Darlene Newman   DOB:  01/20/1961   MRN:  [id number]   Chief Complaint: Hypertension (Meds refills. ); Hyperlipidemia; and Hypothyroidism Hypertension  This is a chronic problem. The problem is controlled. Pertinent negatives include no chest pain, palpitations or shortness of breath. Identifiable causes of hypertension include a thyroid problem.  Hyperlipidemia  This is a chronic problem. The problem is controlled. Pertinent negatives include no chest pain or shortness of breath. Current antihyperlipidemic treatment includes statins. The current treatment provides significant improvement of lipids.  Thyroid Problem  Presents for follow-up visit. Patient reports no fatigue or palpitations. The symptoms have been stable. Her past medical history is significant for hyperlipidemia.  Gastroesophageal Reflux  She complains of heartburn. She reports no chest pain or no wheezing. This is a recurrent problem. The problem occurs rarely. Pertinent negatives include no fatigue. She has tried a PPI for the symptoms. The treatment provided significant relief.  Mood disorder - doing well on Paxil.  Currently going through a divorce and still maintaining. No change in appetite or sleep.    Review of Systems  Constitutional: Negative for chills, fatigue, fever and unexpected weight change.  Respiratory: Negative for chest tightness, shortness of breath and wheezing.   Cardiovascular: Negative for chest pain and palpitations.  Gastrointestinal: Positive for heartburn.    Patient Active Problem List   Diagnosis Date Noted  . Degenerative cervical disc 06/12/2017  . Hypothyroidism due to acquired atrophy of thyroid 11/24/2016  . Renal insufficiency 11/24/2016  . Impingement syndrome of right shoulder 05/20/2015  . Hypertriglyceridemia 02/17/2015  . Benign hypertension 02/17/2015  . Climacteric 02/17/2015  . History of fundoplication 02/17/2015  . Tobacco use disorder,  mild, in sustained remission 02/17/2015  . Calcification of coronary artery 12/25/2012  . Dermatitis contusiformis 05/31/2012  . Acid reflux 05/24/2012  . Lung mass 08/31/2011    Prior to Admission medications   Medication Sig Start Date End Date Taking? Authorizing Provider  albuterol (PROVENTIL HFA;VENTOLIN HFA) 108 (90 Base) MCG/ACT inhaler Inhale into the lungs. 02/02/15  Yes [provider]  dicyclomine (BENTYL) 10 MG capsule TAKE 1 CAPSULE(10 MG) BY MOUTH FOUR TIMES DAILY BEFORE MEALS AND AT BEDTIME 06/18/17  Yes Reubin MilanBerglund, Laura H, MD  levothyroxine (SYNTHROID, LEVOTHROID) 50 MCG tablet Take 1 tablet (50 mcg total) by mouth daily before breakfast. 11/24/16  Yes Reubin MilanBerglund, Laura H, MD  losartan (COZAAR) 50 MG tablet TAKE 1 TABLET BY MOUTH EVERY DAY 06/07/17  Yes Reubin MilanBerglund, Laura H, MD  metoprolol tartrate (LOPRESSOR) 50 MG tablet TAKE 1 TABLET BY MOUTH TWICE DAILY 06/07/17  Yes Reubin MilanBerglund, Laura H, MD  omeprazole (PRILOSEC) 20 MG capsule TAKE 1 CAPSULE BY MOUTH ONCE DAILY. 01/04/17  Yes Reubin MilanBerglund, Laura H, MD  PARoxetine (PAXIL) 20 MG tablet TAKE 1 TABLET BY MOUTH ONCE DAILY. 08/30/16  Yes Reubin MilanBerglund, Laura H, MD  simvastatin (ZOCOR) 40 MG tablet TAKE 1 TABLET BY MOUTH ONCE DAILY. 09/02/16  Yes Reubin MilanBerglund, Laura H, MD    Allergies  Allergen Reactions  . Ibuprofen     - kidney function low so don't take no more.     Past Surgical History:  Procedure Laterality Date  . COLONOSCOPY  2015  . COLONOSCOPY WITH ESOPHAGOGASTRODUODENOSCOPY (EGD)  2015  . GASTRIC FUNDOPLICATION    . HIATAL HERNIA REPAIR  2015  . TOTAL VAGINAL HYSTERECTOMY  2012    Social History  Substance Use Topics  . Smoking status: Former Smoker  Quit date: 10/30/2006  . Smokeless tobacco: Never Used  . Alcohol use No     Medication list has been reviewed and updated.  PHQ 2/9 Scores 06/08/2016  PHQ - 2 Score 0    Physical Exam  Constitutional: She is oriented to person, place, and time. She appears  well-developed. No distress.  HENT:  Head: Normocephalic and atraumatic.  Neck: Normal range of motion. Neck supple.  Cardiovascular: Normal rate, regular rhythm and normal heart sounds.   Pulmonary/Chest: Effort normal and breath sounds normal. No respiratory distress. She has no wheezes.  Musculoskeletal: Normal range of motion. She exhibits no edema or tenderness.  Neurological: She is alert and oriented to person, place, and time.  Skin: Skin is warm and dry. No rash noted.  Psychiatric: She has a normal mood and affect. Her behavior is normal. Thought content normal.  Nursing note and vitals reviewed.   BP 112/68   Pulse (!) 58   Ht 5\' 3"  (1.6 m)   Wt 141 lb (64 kg)   SpO2 100%   BMI 24.98 kg/m   Assessment and Plan: 1. Benign hypertension controlled - metoprolol tartrate (LOPRESSOR) 50 MG tablet; Take 1 tablet (50 mg total) by mouth 2 (two) times daily.  Dispense: 60 tablet; Refill: 5 - losartan (COZAAR) 50 MG tablet; Take 1 tablet (50 mg total) by mouth daily.  Dispense: 30 tablet; Refill: 5 - Comprehensive metabolic panel  2. Hypothyroidism due to acquired atrophy of thyroid supplemented - levothyroxine (SYNTHROID, LEVOTHROID) 50 MCG tablet; Take 1 tablet (50 mcg total) by mouth daily before breakfast.  Dispense: 30 tablet; Refill: 5 - TSH  3. Hypertriglyceridemia On statin therapy - simvastatin (ZOCOR) 40 MG tablet; Take 1 tablet (40 mg total) by mouth daily.  Dispense: 30 tablet; Refill: 5 - Lipid panel  4. Gastroesophageal reflux disease without esophagitis controlled - CBC with Differential/Platelet  5. Breast cancer screening - MM DIGITAL SCREENING BILATERAL; Future   Meds ordered this encounter  Medications  . simvastatin (ZOCOR) 40 MG tablet    Sig: Take 1 tablet (40 mg total) by mouth daily.    Dispense:  30 tablet    Refill:  5  . omeprazole (PRILOSEC) 20 MG capsule    Sig: Take 1 capsule (20 mg total) by mouth daily.    Dispense:  30 capsule     Refill:  5  . metoprolol tartrate (LOPRESSOR) 50 MG tablet    Sig: Take 1 tablet (50 mg total) by mouth 2 (two) times daily.    Dispense:  60 tablet    Refill:  5  . losartan (COZAAR) 50 MG tablet    Sig: Take 1 tablet (50 mg total) by mouth daily.    Dispense:  30 tablet    Refill:  5  . levothyroxine (SYNTHROID, LEVOTHROID) 50 MCG tablet    Sig: Take 1 tablet (50 mcg total) by mouth daily before breakfast.    Dispense:  30 tablet    Refill:  5    Partially dictated using Animal nutritionist. Any errors are unintentional.  Bari Edward, MD [facility]  07/04/2017

## 2017-07-05 LAB — CBC WITH DIFFERENTIAL/PLATELET
BASOS ABS: 0 10*3/uL (ref 0.0–0.2)
Basos: 0 %
EOS (ABSOLUTE): 0.1 10*3/uL (ref 0.0–0.4)
Eos: 2 %
Hematocrit: 35.6 % (ref 34.0–46.6)
Hemoglobin: 12.5 g/dL (ref 11.1–15.9)
Immature Grans (Abs): 0 10*3/uL (ref 0.0–0.1)
Immature Granulocytes: 0 %
LYMPHS ABS: 1.2 10*3/uL (ref 0.7–3.1)
LYMPHS: 20 %
MCH: 33.2 pg — ABNORMAL HIGH (ref 26.6–33.0)
MCHC: 35.1 g/dL (ref 31.5–35.7)
MCV: 95 fL (ref 79–97)
Monocytes Absolute: 0.5 10*3/uL (ref 0.1–0.9)
Monocytes: 9 %
NEUTROS ABS: 3.9 10*3/uL (ref 1.4–7.0)
Neutrophils: 69 %
PLATELETS: 209 10*3/uL (ref 150–379)
RBC: 3.76 x10E6/uL — ABNORMAL LOW (ref 3.77–5.28)
RDW: 13.9 % (ref 12.3–15.4)
WBC: 5.8 10*3/uL (ref 3.4–10.8)

## 2017-07-05 LAB — COMPREHENSIVE METABOLIC PANEL
ALK PHOS: 111 IU/L (ref 39–117)
ALT: 24 IU/L (ref 0–32)
AST: 22 IU/L (ref 0–40)
Albumin/Globulin Ratio: 1.7 (ref 1.2–2.2)
Albumin: 4.2 g/dL (ref 3.5–5.5)
BILIRUBIN TOTAL: 0.6 mg/dL (ref 0.0–1.2)
BUN / CREAT RATIO: 20 (ref 9–23)
BUN: 20 mg/dL (ref 6–24)
CHLORIDE: 104 mmol/L (ref 96–106)
CO2: 26 mmol/L (ref 20–29)
Calcium: 9.5 mg/dL (ref 8.7–10.2)
Creatinine, Ser: 1 mg/dL (ref 0.57–1.00)
GFR calc Af Amer: 73 mL/min/{1.73_m2} (ref >59)
GFR calc non Af Amer: 64 mL/min/{1.73_m2} (ref >59)
GLUCOSE: 95 mg/dL (ref 65–99)
Globulin, Total: 2.5 g/dL (ref 1.5–4.5)
Potassium: 4.3 mmol/L (ref 3.5–5.2)
Sodium: 143 mmol/L (ref 134–144)
Total Protein: 6.7 g/dL (ref 6.0–8.5)

## 2017-07-05 LAB — TSH: TSH: 3.22 u[IU]/mL (ref 0.450–4.500)

## 2017-07-05 LAB — LIPID PANEL
CHOLESTEROL TOTAL: 168 mg/dL (ref 100–199)
Chol/HDL Ratio: 4.7 ratio — ABNORMAL HIGH (ref 0.0–4.4)
HDL: 36 mg/dL — AB (ref >39)
LDL Calculated: 91 mg/dL (ref 0–99)
Triglycerides: 204 mg/dL — ABNORMAL HIGH (ref 0–149)
VLDL CHOLESTEROL CAL: 41 mg/dL — AB (ref 5–40)

## 2017-07-07 ENCOUNTER — Other Ambulatory Visit: Payer: Self-pay | Admitting: Internal Medicine

## 2017-08-01 ENCOUNTER — Ambulatory Visit
Admission: RE | Admit: 2017-08-01 | Discharge: 2017-08-01 | Disposition: A | Discharge status: Home / Self Care | Payer: Commercial Managed Care - PPO | Source: Ambulatory Visit | Attending: Internal Medicine | Admitting: Internal Medicine

## 2017-08-01 DIAGNOSIS — Z1231 Encounter for screening mammogram for malignant neoplasm of breast: Secondary | ICD-10-CM | POA: Diagnosis not present

## 2017-08-01 DIAGNOSIS — Z1239 Encounter for other screening for malignant neoplasm of breast: Secondary | ICD-10-CM

## 2017-08-06 ENCOUNTER — Other Ambulatory Visit: Payer: Self-pay | Admitting: Internal Medicine

## 2017-08-29 ENCOUNTER — Other Ambulatory Visit: Payer: Self-pay | Admitting: Internal Medicine

## 2017-09-06 ENCOUNTER — Other Ambulatory Visit: Payer: Self-pay | Admitting: Internal Medicine

## 2018-01-01 ENCOUNTER — Encounter: Payer: Self-pay | Admitting: Internal Medicine

## 2018-01-01 ENCOUNTER — Ambulatory Visit: Payer: Commercial Managed Care - PPO | Admitting: Internal Medicine

## 2018-01-01 VITALS — BP 138/80 | HR 55 | Temp 97.9°F | Ht 63.0 in | Wt 138.0 lb

## 2018-01-01 DIAGNOSIS — N289 Disorder of kidney and ureter, unspecified: Secondary | ICD-10-CM

## 2018-01-01 DIAGNOSIS — E034 Atrophy of thyroid (acquired): Secondary | ICD-10-CM

## 2018-01-01 DIAGNOSIS — I10 Essential (primary) hypertension: Secondary | ICD-10-CM

## 2018-01-01 DIAGNOSIS — Z0000 Encounter for general adult medical examination without abnormal findings: Secondary | ICD-10-CM

## 2018-01-01 DIAGNOSIS — Z0001 Encounter for general adult medical examination with abnormal findings: Secondary | ICD-10-CM | POA: Diagnosis not present

## 2018-01-01 DIAGNOSIS — E781 Pure hyperglyceridemia: Secondary | ICD-10-CM

## 2018-01-01 DIAGNOSIS — N951 Menopausal and female climacteric states: Secondary | ICD-10-CM | POA: Diagnosis not present

## 2018-01-01 DIAGNOSIS — Z1239 Encounter for other screening for malignant neoplasm of breast: Secondary | ICD-10-CM

## 2018-01-01 DIAGNOSIS — J069 Acute upper respiratory infection, unspecified: Secondary | ICD-10-CM

## 2018-01-01 MED ORDER — LEVOTHYROXINE SODIUM 50 MCG PO TABS
50.0000 ug | ORAL_TABLET | Freq: Every day | ORAL | 5 refills | Status: DC
Start: 2018-01-01 — End: 2018-07-06

## 2018-01-01 MED ORDER — DICYCLOMINE HCL 10 MG PO CAPS: 10.0000 mg | ORAL_CAPSULE | Freq: Three times a day (TID) | ORAL | 5 refills | Status: DC

## 2018-01-01 MED ORDER — SIMVASTATIN 40 MG PO TABS: 40.0000 mg | ORAL_TABLET | Freq: Every day | ORAL | 5 refills | Status: DC

## 2018-01-01 MED ORDER — LOSARTAN POTASSIUM 50 MG PO TABS: 50.0000 mg | ORAL_TABLET | Freq: Every day | ORAL | 5 refills | Status: DC

## 2018-01-01 MED ORDER — METOPROLOL TARTRATE 50 MG PO TABS: 50.0000 mg | ORAL_TABLET | Freq: Two times a day (BID) | ORAL | 5 refills | Status: DC

## 2018-01-01 NOTE — Progress Notes (Signed)
Date:  01/01/2018   Name:  Darlene Newman   DOB:  1961/08/25   MRN:  [id number]   Chief Complaint: Annual Exam (Breast Exam. ) Darlene Newman is a 57 y.o. female who presents today for her Complete Annual Exam. She feels fairly well. She reports exercising some. She reports she is sleeping fairly well. No breast problems. She has noticed a dry tickling cough over the past three days.  Her grandaughter has the flu and lives with her.  She denies fever or chills.  Has a mild headache at times.  No n/v.  She has not been taking any medication.  Hypertension  This is a chronic problem. The problem is controlled. Pertinent negatives include no chest pain, headaches, palpitations or shortness of breath. Identifiable causes of hypertension include a thyroid problem.  Hyperlipidemia  This is a chronic problem. The problem is controlled. Pertinent negatives include no chest pain or shortness of breath. Current antihyperlipidemic treatment includes statins. The current treatment provides significant improvement of lipids.  Thyroid Problem  Presents for follow-up visit. Patient reports no anxiety, constipation, diarrhea, fatigue, palpitations or tremors. The symptoms have been stable. Her past medical history is significant for hyperlipidemia.  IBS with diarrhea - continue Bentyl.  No bleeding, mucus or weight loss.  Colonoscopy is up to date.    Review of Systems  Constitutional: Positive for chills. Negative for fatigue and fever.  HENT: Negative for congestion, hearing loss, tinnitus, trouble swallowing and voice change.   Eyes: Negative for visual disturbance.  Respiratory: Positive for cough. Negative for chest tightness, shortness of breath and wheezing.   Cardiovascular: Negative for chest pain, palpitations and leg swelling.  Gastrointestinal: Negative for abdominal pain, constipation, diarrhea and vomiting.  Endocrine: Negative for polydipsia and polyuria.  Genitourinary: Negative for  dysuria, frequency, genital sores, vaginal bleeding and vaginal discharge.  Musculoskeletal: Negative for arthralgias, gait problem and joint swelling.  Skin: Negative for color change and rash.  Allergic/Immunologic: Negative for environmental allergies.  Neurological: Negative for dizziness, tremors, light-headedness and headaches.  Hematological: Negative for adenopathy. Does not bruise/bleed easily.  Psychiatric/Behavioral: Negative for dysphoric mood and sleep disturbance. The patient is not nervous/anxious.     Patient Active Problem List   Diagnosis Date Noted  . Degenerative cervical disc 06/12/2017  . Hypothyroidism due to acquired atrophy of thyroid 11/24/2016  . Renal insufficiency 11/24/2016  . Impingement syndrome of right shoulder 05/20/2015  . Hypertriglyceridemia 02/17/2015  . Benign hypertension 02/17/2015  . Climacteric 02/17/2015  . History of fundoplication 02/17/2015  . Tobacco use disorder, mild, in sustained remission 02/17/2015  . Atypical chest pain 12/25/2012  . Dermatitis contusiformis 05/31/2012  . Acid reflux 05/24/2012  . Lung mass 08/31/2011    Prior to Admission medications   Medication Sig Start Date End Date Taking? Authorizing Provider  dicyclomine (BENTYL) 10 MG capsule TAKE 1 CAPSULE(10 MG) BY MOUTH FOUR TIMES DAILY BEFORE MEALS AND AT BEDTIME 08/29/17  Yes Reubin Milan, MD  levothyroxine (SYNTHROID, LEVOTHROID) 50 MCG tablet Take 1 tablet (50 mcg total) by mouth daily before breakfast. 07/04/17  Yes Reubin Milan, MD  losartan (COZAAR) 50 MG tablet Take 1 tablet (50 mg total) by mouth daily. 07/04/17  Yes Reubin Milan, MD  metoprolol tartrate (LOPRESSOR) 50 MG tablet Take 1 tablet (50 mg total) by mouth 2 (two) times daily. 07/04/17  Yes Reubin Milan, MD  PARoxetine (PAXIL) 20 MG tablet TAKE 1 TABLET BY MOUTH ONCE DAILY.  09/06/17  Yes Reubin MilanBerglund, Harbor Vanover H, MD  simvastatin (ZOCOR) 40 MG tablet TAKE 1 TABLET BY MOUTH ONCE DAILY. 07/07/17   Yes Reubin MilanBerglund, Michaeleen Down H, MD    Allergies  Allergen Reactions  . Ibuprofen     - kidney function low so don't take no more.     Past Surgical History:  Procedure Laterality Date  . COLONOSCOPY  2015  . COLONOSCOPY WITH ESOPHAGOGASTRODUODENOSCOPY (EGD)  2015  . GASTRIC FUNDOPLICATION    . HIATAL HERNIA REPAIR  2015  . TOTAL VAGINAL HYSTERECTOMY  2012    Social History   Tobacco Use  . Smoking status: Former Smoker    Last attempt to quit: 10/30/2006    Years since quitting: 11.1  . Smokeless tobacco: Never Used  Substance Use Topics  . Alcohol use: No    Alcohol/week: 0.0 oz  . Drug use: No     Medication list has been reviewed and updated.  PHQ 2/9 Scores 06/08/2016  PHQ - 2 Score 0    Physical Exam  Constitutional: She is oriented to person, place, and time. She appears well-developed and well-nourished. No distress.  HENT:  Head: Normocephalic and atraumatic.  Right Ear: Tympanic membrane and ear canal normal.  Left Ear: Tympanic membrane and ear canal normal.  Nose: Right sinus exhibits no maxillary sinus tenderness and no frontal sinus tenderness. Left sinus exhibits no maxillary sinus tenderness and no frontal sinus tenderness.  Mouth/Throat: Uvula is midline. Posterior oropharyngeal erythema (mild) present. No oropharyngeal exudate or posterior oropharyngeal edema.  Eyes: Conjunctivae and EOM are normal. Right eye exhibits no discharge. Left eye exhibits no discharge. No scleral icterus.  Neck: Normal range of motion. Carotid bruit is not present. No erythema present. No thyromegaly present.  Cardiovascular: Normal rate, regular rhythm, normal heart sounds and normal pulses.  Pulmonary/Chest: Effort normal. No respiratory distress. She has no decreased breath sounds. She has no wheezes. She has no rhonchi. Right breast exhibits no mass, no nipple discharge, no skin change and no tenderness. Left breast exhibits no mass, no nipple discharge, no skin change and no  tenderness.  Abdominal: Soft. Bowel sounds are normal. There is no hepatosplenomegaly. There is no tenderness. There is no CVA tenderness.  Musculoskeletal: Normal range of motion. She exhibits no edema or tenderness.  Lymphadenopathy:    She has no cervical adenopathy.    She has no axillary adenopathy.  Neurological: She is alert and oriented to person, place, and time. She has normal reflexes. No cranial nerve deficit or sensory deficit.  Skin: Skin is warm, dry and intact. No rash noted.  Psychiatric: She has a normal mood and affect. Her speech is normal and behavior is normal. Thought content normal.  Nursing note and vitals reviewed.   BP 138/80   Pulse (!) 55   Ht 5\' 3"  (1.6 m)   Wt 138 lb (62.6 kg)   SpO2 97%   BMI 24.45 kg/m   Assessment and Plan: 1. Annual physical exam Normal exam - POCT urinalysis dipstick  2. Breast cancer screening Recently done  3. Benign hypertension controlled - metoprolol tartrate (LOPRESSOR) 50 MG tablet; Take 1 tablet (50 mg total) by mouth 2 (two) times daily.  Dispense: 60 tablet; Refill: 5 - losartan (COZAAR) 50 MG tablet; Take 1 tablet (50 mg total) by mouth daily.  Dispense: 30 tablet; Refill: 5 - CBC with Differential/Platelet - Comprehensive metabolic panel  4. Hypothyroidism due to acquired atrophy of thyroid supplemented - levothyroxine (SYNTHROID, LEVOTHROID) 50  MCG tablet; Take 1 tablet (50 mcg total) by mouth daily before breakfast.  Dispense: 30 tablet; Refill: 5 - TSH  5. Renal insufficiency monitoring - Comprehensive metabolic panel  6. Hypertriglyceridemia Check labs Continue zocor - Lipid panel  7. Climacteric On Paxil with fair control of hot flashes  8. Viral URI Xyzal samples given   Meds ordered this encounter  Medications  . dicyclomine (BENTYL) 10 MG capsule    Sig: Take 1 capsule (10 mg total) by mouth 4 (four) times daily -  before meals and at bedtime.    Dispense:  120 capsule    Refill:  5    . simvastatin (ZOCOR) 40 MG tablet    Sig: Take 1 tablet (40 mg total) by mouth daily.    Dispense:  30 tablet    Refill:  5  . levothyroxine (SYNTHROID, LEVOTHROID) 50 MCG tablet    Sig: Take 1 tablet (50 mcg total) by mouth daily before breakfast.    Dispense:  30 tablet    Refill:  5  . metoprolol tartrate (LOPRESSOR) 50 MG tablet    Sig: Take 1 tablet (50 mg total) by mouth 2 (two) times daily.    Dispense:  60 tablet    Refill:  5  . losartan (COZAAR) 50 MG tablet    Sig: Take 1 tablet (50 mg total) by mouth daily.    Dispense:  30 tablet    Refill:  5    Partially dictated using Animal nutritionist. Any errors are unintentional.  Bari Edward, MD [facility]  01/01/2018

## 2018-01-02 LAB — CBC WITH DIFFERENTIAL/PLATELET
BASOS: 0 %
Basophils Absolute: 0 10*3/uL (ref 0.0–0.2)
EOS (ABSOLUTE): 0.1 10*3/uL (ref 0.0–0.4)
Eos: 3 %
Hematocrit: 38.2 % (ref 34.0–46.6)
Hemoglobin: 13.1 g/dL (ref 11.1–15.9)
IMMATURE GRANULOCYTES: 0 %
Immature Grans (Abs): 0 10*3/uL (ref 0.0–0.1)
Lymphocytes Absolute: 1.4 10*3/uL (ref 0.7–3.1)
Lymphs: 25 %
MCH: 32.5 pg (ref 26.6–33.0)
MCHC: 34.3 g/dL (ref 31.5–35.7)
MCV: 95 fL (ref 79–97)
MONOS ABS: 0.4 10*3/uL (ref 0.1–0.9)
Monocytes: 7 %
NEUTROS PCT: 65 %
Neutrophils Absolute: 3.6 10*3/uL (ref 1.4–7.0)
PLATELETS: 225 10*3/uL (ref 150–379)
RBC: 4.03 x10E6/uL (ref 3.77–5.28)
RDW: 13.3 % (ref 12.3–15.4)
WBC: 5.5 10*3/uL (ref 3.4–10.8)

## 2018-01-02 LAB — COMPREHENSIVE METABOLIC PANEL
A/G RATIO: 1.8 (ref 1.2–2.2)
ALK PHOS: 116 IU/L (ref 39–117)
ALT: 30 IU/L (ref 0–32)
AST: 25 IU/L (ref 0–40)
Albumin: 4.6 g/dL (ref 3.5–5.5)
BILIRUBIN TOTAL: 0.6 mg/dL (ref 0.0–1.2)
BUN/Creatinine Ratio: 17 (ref 9–23)
BUN: 15 mg/dL (ref 6–24)
CALCIUM: 9.4 mg/dL (ref 8.7–10.2)
CO2: 26 mmol/L (ref 20–29)
Chloride: 104 mmol/L (ref 96–106)
Creatinine, Ser: 0.88 mg/dL (ref 0.57–1.00)
GFR calc Af Amer: 86 mL/min/{1.73_m2} (ref >59)
GFR calc non Af Amer: 74 mL/min/{1.73_m2} (ref >59)
Globulin, Total: 2.5 g/dL (ref 1.5–4.5)
Glucose: 94 mg/dL (ref 65–99)
POTASSIUM: 3.8 mmol/L (ref 3.5–5.2)
Sodium: 145 mmol/L — ABNORMAL HIGH (ref 134–144)
Total Protein: 7.1 g/dL (ref 6.0–8.5)

## 2018-01-02 LAB — LIPID PANEL
CHOLESTEROL TOTAL: 172 mg/dL (ref 100–199)
Chol/HDL Ratio: 4.6 ratio — ABNORMAL HIGH (ref 0.0–4.4)
HDL: 37 mg/dL — AB (ref >39)
LDL Calculated: 95 mg/dL (ref 0–99)
TRIGLYCERIDES: 202 mg/dL — AB (ref 0–149)
VLDL Cholesterol Cal: 40 mg/dL (ref 5–40)

## 2018-01-02 LAB — TSH: TSH: 3.92 u[IU]/mL (ref 0.450–4.500)

## 2018-01-05 ENCOUNTER — Other Ambulatory Visit: Payer: Self-pay | Admitting: Internal Medicine

## 2018-01-05 DIAGNOSIS — I10 Essential (primary) hypertension: Secondary | ICD-10-CM

## 2018-01-08 ENCOUNTER — Other Ambulatory Visit: Payer: Self-pay | Admitting: Internal Medicine

## 2018-01-08 DIAGNOSIS — I10 Essential (primary) hypertension: Secondary | ICD-10-CM

## 2018-01-08 DIAGNOSIS — E781 Pure hyperglyceridemia: Secondary | ICD-10-CM

## 2018-03-09 ENCOUNTER — Other Ambulatory Visit: Payer: Self-pay | Admitting: Internal Medicine

## 2018-05-07 ENCOUNTER — Other Ambulatory Visit: Payer: Self-pay | Admitting: Internal Medicine

## 2018-06-01 ENCOUNTER — Other Ambulatory Visit: Payer: Self-pay | Admitting: Internal Medicine

## 2018-06-28 ENCOUNTER — Other Ambulatory Visit: Payer: Self-pay | Admitting: Internal Medicine

## 2018-07-06 ENCOUNTER — Other Ambulatory Visit: Payer: Self-pay | Admitting: Internal Medicine

## 2018-07-06 DIAGNOSIS — I10 Essential (primary) hypertension: Secondary | ICD-10-CM

## 2018-07-06 DIAGNOSIS — E034 Atrophy of thyroid (acquired): Secondary | ICD-10-CM

## 2018-07-28 ENCOUNTER — Other Ambulatory Visit: Payer: Self-pay | Admitting: Internal Medicine

## 2018-08-03 ENCOUNTER — Other Ambulatory Visit: Payer: Self-pay | Admitting: Internal Medicine

## 2018-08-03 DIAGNOSIS — E034 Atrophy of thyroid (acquired): Secondary | ICD-10-CM

## 2018-08-03 DIAGNOSIS — I10 Essential (primary) hypertension: Secondary | ICD-10-CM

## 2018-09-05 IMAGING — CR DG FOOT COMPLETE 3+V*R*
1 series · 3 of 3 positions shown · non-contrast
Comparison: None.

CLINICAL DATA: Right foot pain after a fall due to syncope.

EXAM:
RIGHT FOOT COMPLETE - 3+ VIEW

[Series 1: dg foot complete right · 0.14mm/px · 3 of 3 slices shown]
[im 1/3]
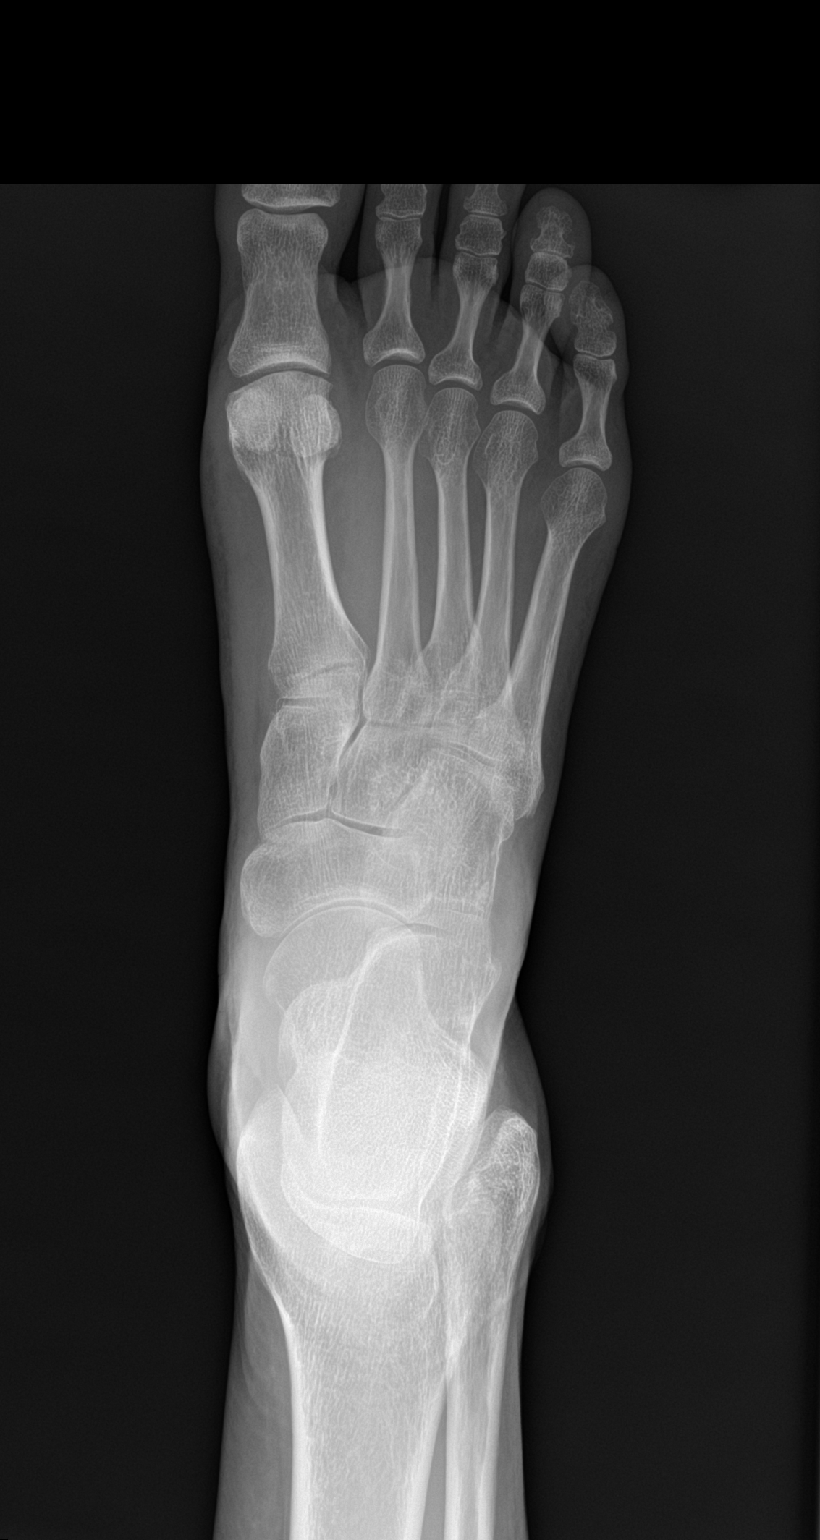
[im 2/3]
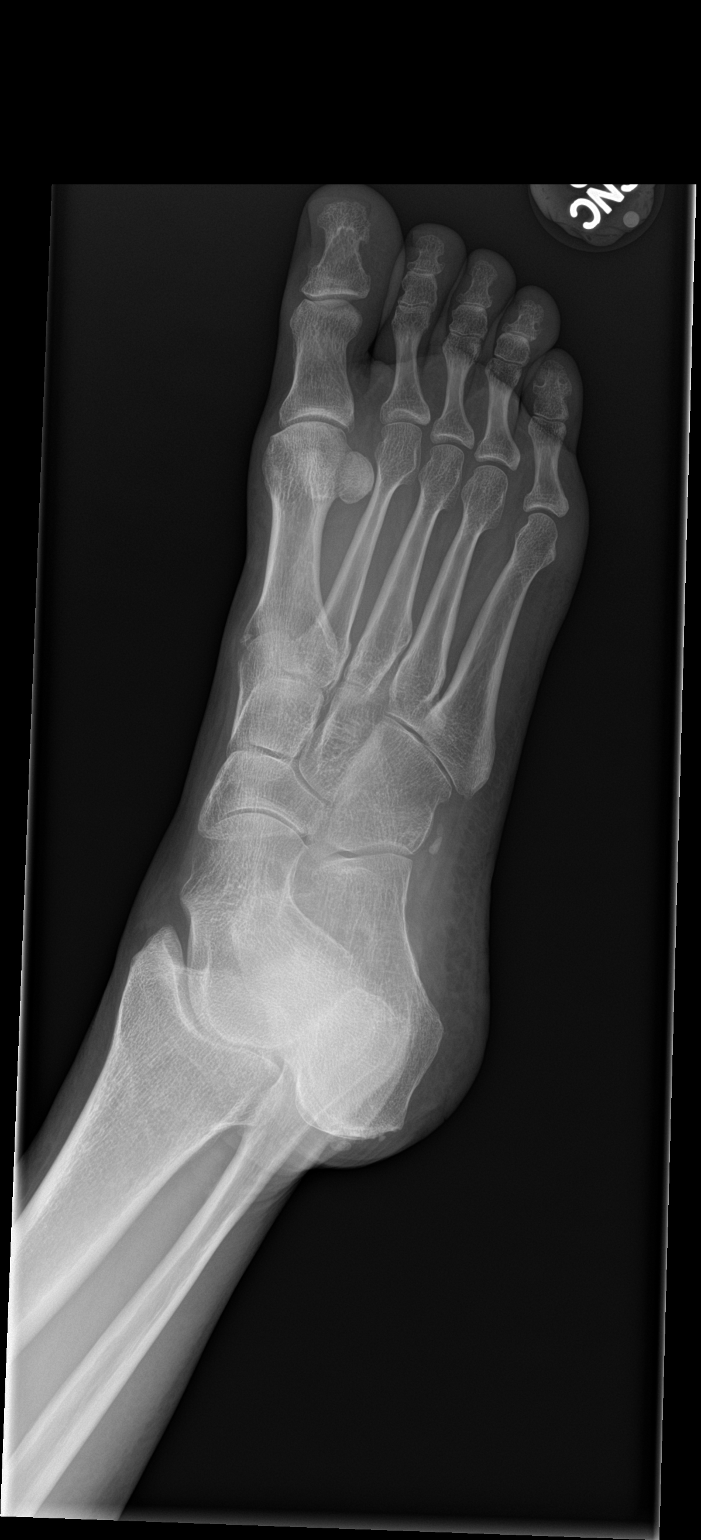
[im 3/3]
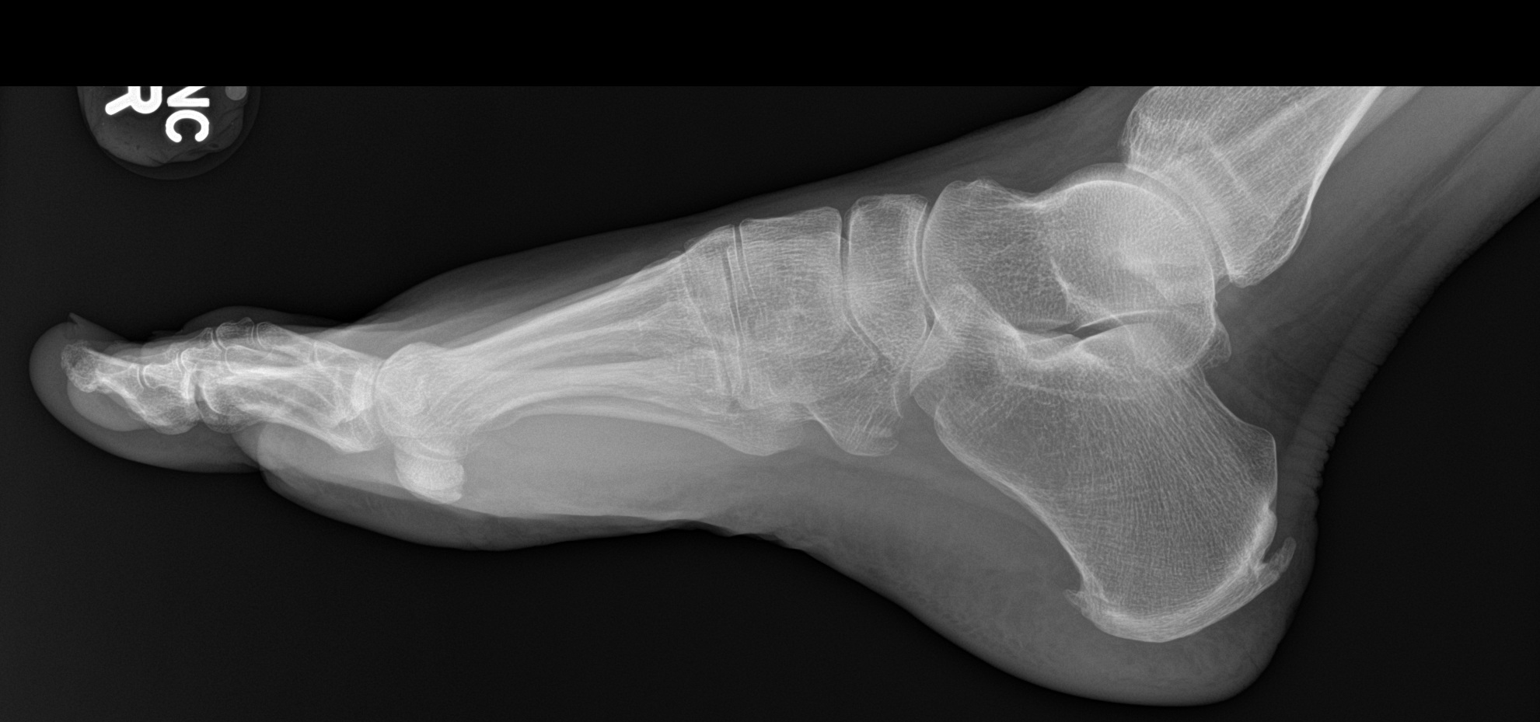

[3 of 3 positions shown; findings below may reference images not displayed]

FINDINGS: There is no fracture or dislocation. Minimal arthritis of the first
metatarsal phalangeal joint. Tiny old avulsion from the distal
medial cuneiform. Soft tissue swelling of the forefoot. Small
calcaneal enthesophytes.
IMPRESSION: No acute bone abnormality.  Soft tissue swelling of the forefoot.

## 2018-09-06 ENCOUNTER — Other Ambulatory Visit: Payer: Self-pay | Admitting: Internal Medicine

## 2018-09-06 DIAGNOSIS — E781 Pure hyperglyceridemia: Secondary | ICD-10-CM

## 2018-09-06 DIAGNOSIS — E034 Atrophy of thyroid (acquired): Secondary | ICD-10-CM

## 2018-09-06 DIAGNOSIS — I10 Essential (primary) hypertension: Secondary | ICD-10-CM

## 2019-01-03 ENCOUNTER — Encounter: Payer: Self-pay | Admitting: Internal Medicine

## 2019-01-04 ENCOUNTER — Other Ambulatory Visit: Payer: Self-pay | Admitting: Internal Medicine

## 2019-01-04 DIAGNOSIS — I10 Essential (primary) hypertension: Secondary | ICD-10-CM

## 2019-01-04 DIAGNOSIS — E034 Atrophy of thyroid (acquired): Secondary | ICD-10-CM

## 2019-01-14 ENCOUNTER — Encounter: Payer: Self-pay | Admitting: Internal Medicine

## 2019-01-21 ENCOUNTER — Encounter: Payer: Self-pay | Admitting: Internal Medicine

## 2019-01-21 ENCOUNTER — Ambulatory Visit (INDEPENDENT_AMBULATORY_CARE_PROVIDER_SITE_OTHER): Payer: Commercial Managed Care - PPO | Admitting: Internal Medicine

## 2019-01-21 ENCOUNTER — Other Ambulatory Visit: Payer: Self-pay

## 2019-01-21 VITALS — BP 128/64 | HR 53 | Ht 63.0 in | Wt 136.0 lb

## 2019-01-21 DIAGNOSIS — R2241 Localized swelling, mass and lump, right lower limb: Secondary | ICD-10-CM | POA: Diagnosis not present

## 2019-01-21 DIAGNOSIS — N3001 Acute cystitis with hematuria: Secondary | ICD-10-CM

## 2019-01-21 DIAGNOSIS — E781 Pure hyperglyceridemia: Secondary | ICD-10-CM | POA: Diagnosis not present

## 2019-01-21 DIAGNOSIS — Z1231 Encounter for screening mammogram for malignant neoplasm of breast: Secondary | ICD-10-CM

## 2019-01-21 DIAGNOSIS — E034 Atrophy of thyroid (acquired): Secondary | ICD-10-CM | POA: Diagnosis not present

## 2019-01-21 DIAGNOSIS — R918 Other nonspecific abnormal finding of lung field: Secondary | ICD-10-CM

## 2019-01-21 DIAGNOSIS — I10 Essential (primary) hypertension: Secondary | ICD-10-CM

## 2019-01-21 DIAGNOSIS — K219 Gastro-esophageal reflux disease without esophagitis: Secondary | ICD-10-CM | POA: Diagnosis not present

## 2019-01-21 DIAGNOSIS — Z0000 Encounter for general adult medical examination without abnormal findings: Secondary | ICD-10-CM

## 2019-01-21 LAB — POCT URINALYSIS DIPSTICK
BILIRUBIN UA: NEGATIVE
GLUCOSE UA: NEGATIVE
KETONES UA: NEGATIVE
Nitrite, UA: NEGATIVE
Protein, UA: NEGATIVE
Spec Grav, UA: 1.015 (ref 1.010–1.025)
Urobilinogen, UA: 0.2 E.U./dL
pH, UA: 6 (ref 5.0–8.0)

## 2019-01-21 MED ORDER — NITROFURANTOIN MONOHYD MACRO 100 MG PO CAPS: 100.0000 mg | ORAL_CAPSULE | Freq: Two times a day (BID) | ORAL | 0 refills | Status: AC

## 2019-01-21 MED ORDER — DICYCLOMINE HCL 10 MG PO CAPS: 10.0000 mg | ORAL_CAPSULE | Freq: Three times a day (TID) | ORAL | 5 refills | Status: DC

## 2019-01-21 MED ORDER — OMEPRAZOLE 40 MG PO CPDR: 40.0000 mg | DELAYED_RELEASE_CAPSULE | Freq: Every day | ORAL | 3 refills | Status: DC

## 2019-01-21 MED ORDER — SIMVASTATIN 40 MG PO TABS: 40.0000 mg | ORAL_TABLET | Freq: Every day | ORAL | 1 refills | Status: DC

## 2019-01-21 NOTE — Progress Notes (Addendum)
Date:  01/21/2019   Name:  Darlene Newman   DOB:  [birth year]   MRN:  [id number]   Chief Complaint: Annual Exam (Breast Exam. No pap.) Darlene Newman is a 58 y.o. female who presents today for her Complete Annual Exam. She feels well. She reports exercising none. She reports she is sleeping well. She denies breast issues.  Last Mammogram 07/2017 Last colonoscopy 10/2013 Pap no longer needed  Hypertension  This is a chronic problem. The problem is controlled. Pertinent negatives include no chest pain, headaches, palpitations or shortness of breath. Past treatments include angiotensin blockers. The current treatment provides significant improvement. Identifiable causes of hypertension include a thyroid problem.  Hyperlipidemia  The problem is controlled. Pertinent negatives include no chest pain or shortness of breath. Current antihyperlipidemic treatment includes statins. The current treatment provides significant improvement of lipids.  Thyroid Problem  Presents for follow-up visit. Patient reports no anxiety, constipation, diarrhea, fatigue, palpitations or tremors. Her past medical history is significant for hyperlipidemia. (S/p nissan fundiplication)  Abdominal Pain  This is a recurrent problem. The problem occurs intermittently. The problem has been unchanged. The pain is located in the epigastric region. The pain is mild. The quality of the pain is cramping. The abdominal pain does not radiate. Associated symptoms include frequency. Pertinent negatives include no arthralgias, constipation, diarrhea, dysuria, fever, headaches or vomiting. Her past medical history is significant for GERD. s/p nissan fundiplication  Foot Injury   Injury mechanism: fracture of foot in 2017. The pain is present in the right foot. Quality: numbness in toes. The pain is mild. The pain has been fluctuating since onset. Associated symptoms include numbness (in toes of right foot). The symptoms are aggravated  by weight bearing. She has tried nothing for the symptoms.   Chest CT 08/2011:  3 subcentimeter nodules in the right lung, the largest measuring 5 mm. Recommend followup chest CT in 12 months. Chest CT 2014: 1. A few small poorly defined nodules in the right lung are stable from the prior exam in November 2012, strongly suggestive of infectious or inflammatory foci. No new or enlarging pulmonary nodules are identified. An additional followup chest CT can be obtained in 9-12 months to establish 2 years of stability.  Review of Systems  Constitutional: Negative for chills, fatigue and fever.  HENT: Negative for congestion, hearing loss, tinnitus, trouble swallowing and voice change.   Eyes: Negative for visual disturbance.  Respiratory: Negative for cough, chest tightness, shortness of breath and wheezing.   Cardiovascular: Negative for chest pain, palpitations and leg swelling.  Gastrointestinal: Positive for abdominal distention (and upper abdomen pressure). Negative for abdominal pain, constipation, diarrhea and vomiting.  Endocrine: Negative for polydipsia and polyuria.  Genitourinary: Positive for frequency and urgency. Negative for dysuria, genital sores, vaginal bleeding and vaginal discharge.  Musculoskeletal: Positive for joint swelling (swelling of top of right foot). Negative for arthralgias and gait problem.  Skin: Negative for color change and rash.  Neurological: Positive for numbness (in toes of right foot). Negative for dizziness, tremors, light-headedness and headaches.  Hematological: Negative for adenopathy. Does not bruise/bleed easily.  Psychiatric/Behavioral: Negative for dysphoric mood and sleep disturbance. The patient is not nervous/anxious.     Patient Active Problem List   Diagnosis Date Noted   Degenerative cervical disc 06/12/2017   Hypothyroidism due to acquired atrophy of thyroid 11/24/2016   Renal insufficiency 11/24/2016   Impingement syndrome of  right shoulder 05/20/2015   Hypertriglyceridemia 02/17/2015   Benign  hypertension 02/17/2015   Climacteric 02/17/2015   History of fundoplication 02/17/2015   Tobacco use disorder, mild, in sustained remission 02/17/2015   Dermatitis contusiformis 05/31/2012   Acid reflux 05/24/2012   Lung mass 08/31/2011    Allergies  Allergen Reactions   Ibuprofen     - kidney function low so don't take no more.     Past Surgical History:  Procedure Laterality Date   COLONOSCOPY  2015   COLONOSCOPY WITH ESOPHAGOGASTRODUODENOSCOPY (EGD)  2015   GASTRIC FUNDOPLICATION     HIATAL HERNIA REPAIR  2015   TOTAL VAGINAL HYSTERECTOMY  2012    Social History   Tobacco Use   Smoking status: Former Smoker    Packs/day: 1.50    Years: 20.00    Pack years: 30.00    Types: Cigarettes    Last attempt to quit: 10/30/2006    Years since quitting: 12.2   Smokeless tobacco: Never Used  Substance Use Topics   Alcohol use: No    Alcohol/week: 0.0 standard drinks   Drug use: No     Medication list has been reviewed and updated.  Current Meds  Medication Sig   dicyclomine (BENTYL) 10 MG capsule TAKE 1 CAPSULE(10 MG) BY MOUTH FOUR TIMES DAILY BEFORE MEALS AND AT BEDTIME   levothyroxine (SYNTHROID, LEVOTHROID) 50 MCG tablet TAKE 1 TABLET(50 MCG) BY MOUTH DAILY BEFORE BREAKFAST   losartan (COZAAR) 50 MG tablet Take 1 tablet (50 mg total) by mouth daily.   metoprolol tartrate (LOPRESSOR) 50 MG tablet TAKE 1 TABLET(50 MG) BY MOUTH TWICE DAILY   PARoxetine (PAXIL) 20 MG tablet TAKE 1 TABLET BY MOUTH ONCE DAILY.   simvastatin (ZOCOR) 40 MG tablet TAKE 1 TABLET(40 MG) BY MOUTH DAILY    PHQ 2/9 Scores 01/21/2019 06/08/2016  PHQ - 2 Score 0 0    Physical Exam Vitals signs and nursing note reviewed.  Constitutional:      General: She is not in acute distress.    Appearance: She is well-developed.  HENT:     Head: Normocephalic and atraumatic.     Right Ear: Tympanic membrane and  ear canal normal.     Left Ear: Tympanic membrane and ear canal normal.     Nose:     Right Sinus: No maxillary sinus tenderness.     Left Sinus: No maxillary sinus tenderness.     Mouth/Throat:     Mouth: Mucous membranes are moist.     Pharynx: Uvula midline.  Eyes:     General: No scleral icterus.       Right eye: No discharge.        Left eye: No discharge.     Conjunctiva/sclera: Conjunctivae normal.  Neck:     Musculoskeletal: Normal range of motion. No erythema.     Thyroid: No thyromegaly.     Vascular: No carotid bruit.  Cardiovascular:     Rate and Rhythm: Normal rate and regular rhythm.     Pulses: Normal pulses.     Heart sounds: Normal heart sounds.  Pulmonary:     Effort: Pulmonary effort is normal. No respiratory distress.     Breath sounds: No wheezing.  Chest:     Breasts:        Right: No mass, nipple discharge, skin change or tenderness.        Left: No mass, nipple discharge, skin change or tenderness.  Abdominal:     General: Bowel sounds are normal.     Palpations: Abdomen is  soft.     Tenderness: There is abdominal tenderness in the epigastric area and suprapubic area. There is no guarding or rebound.     Hernia: No hernia is present.  Musculoskeletal: Normal range of motion.       Feet:  Lymphadenopathy:     Cervical: No cervical adenopathy.  Skin:    General: Skin is warm and dry.     Findings: No rash.       Neurological:     Mental Status: She is alert and oriented to person, place, and time.     Cranial Nerves: No cranial nerve deficit.     Sensory: No sensory deficit.     Deep Tendon Reflexes: Reflexes are normal and symmetric.  Psychiatric:        Speech: Speech normal.        Behavior: Behavior normal.        Thought Content: Thought content normal.     Wt Readings from Last 3 Encounters:  01/21/19 136 lb (61.7 kg)  01/01/18 138 lb (62.6 kg)  07/04/17 141 lb (64 kg)    BP 128/64    Pulse (!) 53    Ht 5\' 3"  (1.6 m)    Wt 136  lb (61.7 kg)    SpO2 99%    BMI 24.09 kg/m   Assessment and Plan: 1. Annual physical exam Normal exam Recommend Dermatology skin check - POCT urinalysis dipstick  2. Encounter for screening mammogram for breast cancer - MM 3D SCREEN BREAST BILATERAL; Future  3. Benign hypertension controlled - CBC with Differential/Platelet - Comprehensive metabolic panel  4. Hypothyroidism due to acquired atrophy of thyroid supplemented - TSH  5. Hypertriglyceridemia Continue statin - Lipid panel - simvastatin (ZOCOR) 40 MG tablet; Take 1 tablet (40 mg total) by mouth daily at 6 PM.  Dispense: 90 tablet; Refill: 1  6. Gastroesophageal reflux disease without esophagitis Recurrent sx - resume PPI for one month If no improvement will need to see GI - omeprazole (PRILOSEC) 40 MG capsule; Take 1 capsule (40 mg total) by mouth daily.  Dispense: 30 capsule; Refill: 3 - dicyclomine (BENTYL) 10 MG capsule; Take 1 capsule (10 mg total) by mouth 4 (four) times daily -  before meals and at bedtime.  Dispense: 120 capsule; Refill: 5  7. Lung mass Needs follow up - AMB  Referral to Pulmonary Nodule Clinic  8. Acute cystitis with hematuria Increase fluids - nitrofurantoin, macrocrystal-monohydrate, (MACROBID) 100 MG capsule; Take 1 capsule (100 mg total) by mouth 2 (two) times daily for 7 days.  Dispense: 14 capsule; Refill: 0  9. Localized swelling of right foot Since fracture in 2017; now with numbness of toes Suspect cyst or neuroma Pt will call her Podiatry to schedule appt   Partially dictated using Dragon software. Any errors are unintentional.  Bari EdwardLaura Yarnell Kozloski, MD [facility]  01/21/2019

## 2019-01-21 NOTE — Patient Instructions (Addendum)
Arnold Long, DPM  [address]  [place], [place] [address]  [phone]  [phone] (Fax)  Podiatry  Call to schedule a Dermatology evaluation - dermatology of your choice.  Take Nitrofurantoin for 7 days for bladder infection (UTI)

## 2019-01-22 LAB — CBC WITH DIFFERENTIAL/PLATELET
BASOS ABS: 0 10*3/uL (ref 0.0–0.2)
Basos: 1 %
EOS (ABSOLUTE): 0.1 10*3/uL (ref 0.0–0.4)
EOS: 2 %
HEMATOCRIT: 36.2 % (ref 34.0–46.6)
HEMOGLOBIN: 12.4 g/dL (ref 11.1–15.9)
IMMATURE GRANULOCYTES: 0 %
Immature Grans (Abs): 0 10*3/uL (ref 0.0–0.1)
Lymphocytes Absolute: 1.4 10*3/uL (ref 0.7–3.1)
Lymphs: 23 %
MCH: 33.5 pg — ABNORMAL HIGH (ref 26.6–33.0)
MCHC: 34.3 g/dL (ref 31.5–35.7)
MCV: 98 fL — ABNORMAL HIGH (ref 79–97)
MONOCYTES: 9 %
MONOS ABS: 0.6 10*3/uL (ref 0.1–0.9)
NEUTROS PCT: 65 %
Neutrophils Absolute: 4 10*3/uL (ref 1.4–7.0)
Platelets: 219 10*3/uL (ref 150–450)
RBC: 3.7 x10E6/uL — AB (ref 3.77–5.28)
RDW: 13 % (ref 11.7–15.4)
WBC: 6.2 10*3/uL (ref 3.4–10.8)

## 2019-01-22 LAB — COMPREHENSIVE METABOLIC PANEL
ALT: 30 IU/L (ref 0–32)
AST: 27 IU/L (ref 0–40)
Albumin/Globulin Ratio: 1.9 (ref 1.2–2.2)
Albumin: 4.5 g/dL (ref 3.8–4.9)
Alkaline Phosphatase: 114 IU/L (ref 39–117)
BILIRUBIN TOTAL: 0.4 mg/dL (ref 0.0–1.2)
BUN / CREAT RATIO: 16 (ref 9–23)
BUN: 15 mg/dL (ref 6–24)
CALCIUM: 9.5 mg/dL (ref 8.7–10.2)
CO2: 24 mmol/L (ref 20–29)
Chloride: 103 mmol/L (ref 96–106)
Creatinine, Ser: 0.92 mg/dL (ref 0.57–1.00)
GFR, EST AFRICAN AMERICAN: 80 mL/min/{1.73_m2} (ref >59)
GFR, EST NON AFRICAN AMERICAN: 70 mL/min/{1.73_m2} (ref >59)
Globulin, Total: 2.4 g/dL (ref 1.5–4.5)
Glucose: 102 mg/dL — ABNORMAL HIGH (ref 65–99)
Potassium: 3.8 mmol/L (ref 3.5–5.2)
SODIUM: 143 mmol/L (ref 134–144)
Total Protein: 6.9 g/dL (ref 6.0–8.5)

## 2019-01-22 LAB — LIPID PANEL
CHOL/HDL RATIO: 4.5 ratio — AB (ref 0.0–4.4)
Cholesterol, Total: 172 mg/dL (ref 100–199)
HDL: 38 mg/dL — ABNORMAL LOW (ref >39)
LDL CALC: 101 mg/dL — AB (ref 0–99)
Triglycerides: 167 mg/dL — ABNORMAL HIGH (ref 0–149)
VLDL CHOLESTEROL CAL: 33 mg/dL (ref 5–40)

## 2019-01-22 LAB — TSH: TSH: 4.4 u[IU]/mL (ref 0.450–4.500)

## 2019-01-28 ENCOUNTER — Other Ambulatory Visit: Payer: Self-pay | Admitting: Oncology

## 2019-01-28 DIAGNOSIS — Z87891 Personal history of nicotine dependence: Secondary | ICD-10-CM

## 2019-01-28 DIAGNOSIS — R911 Solitary pulmonary nodule: Secondary | ICD-10-CM

## 2019-01-28 NOTE — Progress Notes (Signed)
  Pulmonary Nodule Clinic Telephone Note  Received referral from PCP, Dr. Judithann Graves.   Per most recent guidelines and recommendations from Fleischner Society (2017), this patient requires a CT scan without contrast ASAP.   Patient was recently evaluated by PCP Dr. Judithann Graves on 01/21/2019 for annual assessment and evaluation.  Previous imaging reviewed.  It appears patient had chest CT in November 2012 and again in 2014 revealing several lung nodules and right lung measuring up to 5 mm in size.  Repeat imaging was recommended.  Chest x-ray from 08/07/2016 did not reveal any abnormalities. Per Fleischner guidelines (2017), CT scan without contrast is recommended ASAP to follow-up on previously noted lung nodules.  High risk factors include: History of heavy smoking, exposure to asbestos, radium or uranium, personal family history of lung cancer, older age, sex (females greater than males), race (black and native [place] greater than weight), marginal speculation, upper lobe location, multiplicity (less than 5 nodules increases risk for malignancy) and emphysema and/or pulmonary fibrosis.   This recommendation follows the consensus statement: Guidelines for Management of Incidental Pulmonary Nodules Detected on CT Images: From the Fleischner Society 2017; Radiology 2017; 284:228-243.    I have placed order for CT scan without contrast to be completed ASAP.  I would like to see her in our pulmonary nodule clinic after her scan.  We will get this scheduled for her.  Durenda Hurt, NP 12/11/2018 2:22 PM

## 2019-03-07 ENCOUNTER — Other Ambulatory Visit: Payer: Self-pay | Admitting: Internal Medicine

## 2019-03-07 DIAGNOSIS — E781 Pure hyperglyceridemia: Secondary | ICD-10-CM

## 2019-03-13 ENCOUNTER — Telehealth: Payer: Self-pay | Admitting: Oncology

## 2019-03-13 ENCOUNTER — Ambulatory Visit: Payer: Commercial Managed Care - PPO

## 2019-03-14 ENCOUNTER — Inpatient Hospital Stay: Payer: Commercial Managed Care - PPO | Admitting: Oncology

## 2019-03-14 ENCOUNTER — Ambulatory Visit: Payer: Commercial Managed Care - PPO

## 2019-03-21 ENCOUNTER — Ambulatory Visit
Admission: RE | Admit: 2019-03-21 | Discharge: 2019-03-21 | Disposition: A | Discharge status: Home / Self Care | Payer: Commercial Managed Care - PPO | Source: Ambulatory Visit | Attending: Oncology | Admitting: Oncology

## 2019-03-21 ENCOUNTER — Other Ambulatory Visit: Payer: Self-pay

## 2019-03-21 DIAGNOSIS — Z87891 Personal history of nicotine dependence: Secondary | ICD-10-CM

## 2019-03-21 DIAGNOSIS — R911 Solitary pulmonary nodule: Secondary | ICD-10-CM | POA: Diagnosis present

## 2019-03-25 ENCOUNTER — Inpatient Hospital Stay: Payer: Commercial Managed Care - PPO | Admitting: Oncology

## 2019-03-25 ENCOUNTER — Other Ambulatory Visit: Payer: Self-pay

## 2019-03-25 ENCOUNTER — Inpatient Hospital Stay: Payer: Commercial Managed Care - PPO | Attending: Oncology | Admitting: Hospice and Palliative Medicine

## 2019-03-25 ENCOUNTER — Encounter: Payer: Self-pay | Admitting: Oncology

## 2019-03-25 DIAGNOSIS — M25511 Pain in right shoulder: Secondary | ICD-10-CM | POA: Diagnosis not present

## 2019-03-25 DIAGNOSIS — R911 Solitary pulmonary nodule: Secondary | ICD-10-CM

## 2019-03-25 DIAGNOSIS — Z79899 Other long term (current) drug therapy: Secondary | ICD-10-CM

## 2019-03-25 DIAGNOSIS — G8929 Other chronic pain: Secondary | ICD-10-CM | POA: Diagnosis not present

## 2019-03-25 DIAGNOSIS — K219 Gastro-esophageal reflux disease without esophagitis: Secondary | ICD-10-CM | POA: Diagnosis not present

## 2019-03-25 DIAGNOSIS — R918 Other nonspecific abnormal finding of lung field: Secondary | ICD-10-CM | POA: Insufficient documentation

## 2019-03-25 DIAGNOSIS — I700 Atherosclerosis of aorta: Secondary | ICD-10-CM | POA: Diagnosis not present

## 2019-03-25 DIAGNOSIS — K449 Diaphragmatic hernia without obstruction or gangrene: Secondary | ICD-10-CM | POA: Insufficient documentation

## 2019-03-25 DIAGNOSIS — Z87891 Personal history of nicotine dependence: Secondary | ICD-10-CM | POA: Insufficient documentation

## 2019-03-25 DIAGNOSIS — I2510 Atherosclerotic heart disease of native coronary artery without angina pectoris: Secondary | ICD-10-CM | POA: Diagnosis not present

## 2019-03-25 DIAGNOSIS — I10 Essential (primary) hypertension: Secondary | ICD-10-CM | POA: Diagnosis not present

## 2019-03-25 DIAGNOSIS — E785 Hyperlipidemia, unspecified: Secondary | ICD-10-CM | POA: Diagnosis not present

## 2019-03-25 DIAGNOSIS — F329 Major depressive disorder, single episode, unspecified: Secondary | ICD-10-CM | POA: Insufficient documentation

## 2019-03-25 DIAGNOSIS — R05 Cough: Secondary | ICD-10-CM | POA: Diagnosis not present

## 2019-03-25 NOTE — Progress Notes (Unsigned)
Patient here to discuss CT screening results.

## 2019-03-25 NOTE — Progress Notes (Signed)
Opened in error    Review of systems- ROS   Physical Exam

## 2019-03-25 NOTE — Progress Notes (Signed)
Pulmonary Nodule Clinic Consult note [facility]  Telephone:([phone] [phone]  Patient Care Team: Reubin Milan, MD as PCP - General (Family Medicine)   Name of the patient: Darlene Newman  [id number]  1961-05-14   Date of visit: 03/25/2019   Diagnosis- lung mass  Chief complaint/ Reason for visit- Pulmonary Nodule Clinic Initial Visit  Past Medical History:  Patient is managed/referred by Dr. Judithann Graves  Interval history-patient presents to the clinic today for follow-up CT results.  She denies any changes or concerns today.  She continues to have a chronic nonproductive cough, which is been evaluated by PCP and pulmonary.  No fevers or chills.  No chest pain.  No weight loss or night sweats.  Denies any neurologic complaints. Denies recent fevers or illnesses. Denies any easy bleeding or bruising. Reports good appetite and denies weight loss. Denies chest pain. Denies any nausea, vomiting, constipation, or diarrhea. Denies urinary complaints. Patient offers no further specific complaints today.   ECOG FS:0 - Asymptomatic  Review of systems- ROS   Allergies  Allergen Reactions  . Ibuprofen     - kidney function low so don't take no more.      Past Medical History:  Diagnosis Date  . Acute cystitis   . CAD (coronary artery disease)   . Chronic right shoulder pain   . Depression   . GERD (gastroesophageal reflux disease)   . Hyperlipidemia   . Hypertension   . Lung mass      Past Surgical History:  Procedure Laterality Date  . COLONOSCOPY  2015  . COLONOSCOPY WITH ESOPHAGOGASTRODUODENOSCOPY (EGD)  2015  . GASTRIC FUNDOPLICATION    . HIATAL HERNIA REPAIR  2015  . TOTAL VAGINAL HYSTERECTOMY  2012    Social History   Socioeconomic History  . Marital status: Single    Spouse name: Not on file  . Number of children: Not on file  . Years of education: Not on file  . Highest education level: Not on file  Occupational History   . Not on file  Social Needs  . Financial resource strain: Not on file  . Food insecurity:    Worry: Not on file    Inability: Not on file  . Transportation needs:    Medical: Not on file    Non-medical: Not on file  Tobacco Use  . Smoking status: Former Smoker    Packs/day: 1.50    Years: 20.00    Pack years: 30.00    Types: Cigarettes    Last attempt to quit: 10/30/2006    Years since quitting: 12.4  . Smokeless tobacco: Never Used  Substance and Sexual Activity  . Alcohol use: No    Alcohol/week: 0.0 standard drinks  . Drug use: No  . Sexual activity: Yes  Lifestyle  . Physical activity:    Days per week: Not on file    Minutes per session: Not on file  . Stress: Not on file  Relationships  . Social connections:    Talks on phone: Not on file    Gets together: Not on file    Attends religious service: Not on file    Active member of club or organization: Not on file    Attends meetings of clubs or organizations: Not on file    Relationship status: Not on file  . Intimate partner violence:    Fear of current or ex partner: Not on file    Emotionally abused: Not on file  Physically abused: Not on file    Forced sexual activity: Not on file  Other Topics Concern  . Not on file  Social History Narrative  . Not on file    Family History  Problem Relation Age of Onset  . Diabetes Mother   . Heart disease Mother   . Diabetes Sister      Current Outpatient Medications:  .  dicyclomine (BENTYL) 10 MG capsule, Take 1 capsule (10 mg total) by mouth 4 (four) times daily -  before meals and at bedtime., Disp: 120 capsule, Rfl: 5 .  levothyroxine (SYNTHROID, LEVOTHROID) 50 MCG tablet, TAKE 1 TABLET(50 MCG) BY MOUTH DAILY BEFORE BREAKFAST, Disp: 30 tablet, Rfl: 5 .  losartan (COZAAR) 50 MG tablet, Take 1 tablet (50 mg total) by mouth daily., Disp: 30 tablet, Rfl: 5 .  metoprolol tartrate (LOPRESSOR) 50 MG tablet, TAKE 1 TABLET(50 MG) BY MOUTH TWICE DAILY, Disp: 60  tablet, Rfl: 5 .  omeprazole (PRILOSEC) 40 MG capsule, Take 1 capsule (40 mg total) by mouth daily., Disp: 30 capsule, Rfl: 3 .  PARoxetine (PAXIL) 20 MG tablet, TAKE 1 TABLET BY MOUTH ONCE DAILY., Disp: 30 tablet, Rfl: 5 .  simvastatin (ZOCOR) 40 MG tablet, TAKE 1 TABLET(40 MG) BY MOUTH DAILY, Disp: 30 tablet, Rfl: 12  Physical exam: There were no vitals filed for this visit. Physical Exam Constitutional:      Appearance: Normal appearance.  Cardiovascular:     Rate and Rhythm: Normal rate and regular rhythm.  Pulmonary:     Effort: Pulmonary effort is normal.     Breath sounds: Normal breath sounds.  Abdominal:     General: Bowel sounds are normal.     Palpations: Abdomen is soft.  Skin:    General: Skin is warm and dry.  Neurological:     General: No focal deficit present.     Mental Status: She is alert and oriented to person, place, and time.      CMP Latest Ref Rng & Units 01/21/2019  Glucose 65 - 99 mg/dL 161(W102(H)  BUN 6 - 24 mg/dL 15  Creatinine 9.600.57 - 4.541.00 mg/dL 0.980.92  Sodium 119134 - 147144 mmol/L 143  Potassium 3.5 - 5.2 mmol/L 3.8  Chloride 96 - 106 mmol/L 103  CO2 20 - 29 mmol/L 24  Calcium 8.7 - 10.2 mg/dL 9.5  Total Protein 6.0 - 8.5 g/dL 6.9  Total Bilirubin 0.0 - 1.2 mg/dL 0.4  Alkaline Phos 39 - 117 IU/L 114  AST 0 - 40 IU/L 27  ALT 0 - 32 IU/L 30   CBC Latest Ref Rng & Units 01/21/2019  WBC 3.4 - 10.8 x10E3/uL 6.2  Hemoglobin 11.1 - 15.9 g/dL 82.912.4  Hematocrit 56.234.0 - 46.6 % 36.2  Platelets 150 - 450 x10E3/uL 219    No images are attached to the encounter.  Ct Chest Wo Contrast  Result Date: 03/21/2019 CLINICAL DATA:  Lung nodules.  Smoker. EXAM: CT CHEST WITHOUT CONTRAST TECHNIQUE: Multidetector CT imaging of the chest was performed following the standard protocol without IV contrast. COMPARISON:  None. FINDINGS: Cardiovascular: Atherosclerotic calcification of the aorta and coronary arteries. Heart size normal. No pericardial effusion. Mediastinum/Nodes: No  pathologically enlarged mediastinal or axillary lymph nodes. Hilar regions are difficult to evaluate without IV contrast but appear grossly unremarkable. Esophagus is grossly unremarkable. Lungs/Pleura: A few scattered pulmonary nodules measure up to 5 mm bilaterally. Subpleural scarring is seen in the upper lobes. No pleural fluid. Airway is unremarkable. Upper Abdomen: Visualized  portions of the liver, gallbladder, adrenal glands, kidneys, spleen, pancreas, stomach and bowel are unremarkable with exception of a moderate hiatal hernia. No upper abdominal adenopathy. Musculoskeletal: No worrisome lytic or sclerotic lesions. IMPRESSION: 1. Pulmonary nodules measure 5 mm or less in size. In the absence of available prior examinations for comparison, non-contrast chest CT can be considered in 12 months as patient is a current smoker. This recommendation follows the consensus statement: Guidelines for Management of Incidental Pulmonary Nodules Detected on CT Images: From the Fleischner Society 2017; Radiology 2017; 284:228-243. 2. Moderate hiatal hernia. 3. Aortic atherosclerosis (ICD10-170.0). Coronary artery calcification. Electronically Signed   By: Leanna Battles M.D.   On: 03/21/2019 15:43     Assessment and plan- Patient is a 58 y.o. female history of chronic lung mass and chronic cough who recently underwent noncontrast CT scan of the chest.  CT revealed several pulmonary nodules measuring 5 mm or less in size.  Recommendations were for 22-month follow-up.  Note that she is not currently a smoker and she reports quitting 11 years ago.  Patient continues to have a chronic nonproductive cough which she has had since at least 2012.  Patient describes having had gastric and esophageal procedures, which did not resolve her symptoms.  Consider referral to ENT if felt clinically appropriate.  I reviewed with patient the results of the CT scan and recommendations for 12-month follow-up for which she was in  agreement.  I spoke by phone with Boneta Lucks, NP, who will coordinate ordering CT scan per lung nodule clinic protocol. One year follow up visit with Boneta Lucks, NP.    Visit Diagnosis Lung mass  Patient expressed understanding and was in agreement with this plan. She also understands that She can call clinic at any time with any questions, concerns, or complaints.   Greater than 50% was spent in counseling and coordination of care with this patient including but not limited to discussion of the relevant topics above (See A&P) including, but not limited to diagnosis and management of acute and chronic medical conditions.   Thank you for allowing me to participate in the care of this very pleasant patient.    Time Total: 15 minutes  Visit consisted of counseling and education dealing with the complex and emotionally intense issues of symptom management and palliative care in the setting of serious and potentially life-threatening illness.Greater than 50%  of this time was spent counseling and coordinating care related to the above assessment and plan.  Signed by: Laurette Schimke, PhD, NP-C [phone] (Work Cell)

## 2019-05-03 ENCOUNTER — Other Ambulatory Visit: Payer: Self-pay | Admitting: Internal Medicine

## 2019-05-03 DIAGNOSIS — I10 Essential (primary) hypertension: Secondary | ICD-10-CM

## 2019-05-25 ENCOUNTER — Other Ambulatory Visit: Payer: Self-pay | Admitting: Internal Medicine

## 2019-05-25 DIAGNOSIS — K219 Gastro-esophageal reflux disease without esophagitis: Secondary | ICD-10-CM

## 2019-06-26 ENCOUNTER — Other Ambulatory Visit: Payer: Self-pay

## 2019-06-26 DIAGNOSIS — R05 Cough: Secondary | ICD-10-CM

## 2019-06-26 DIAGNOSIS — Z20822 Contact with and (suspected) exposure to covid-19: Secondary | ICD-10-CM

## 2019-06-26 DIAGNOSIS — R509 Fever, unspecified: Secondary | ICD-10-CM

## 2019-06-27 LAB — NOVEL CORONAVIRUS, NAA: SARS-CoV-2, NAA: NOT DETECTED

## 2019-06-30 ENCOUNTER — Ambulatory Visit (INDEPENDENT_AMBULATORY_CARE_PROVIDER_SITE_OTHER): Payer: Commercial Managed Care - PPO | Admitting: Internal Medicine

## 2019-06-30 ENCOUNTER — Other Ambulatory Visit: Payer: Self-pay

## 2019-06-30 ENCOUNTER — Encounter: Payer: Self-pay | Admitting: Internal Medicine

## 2019-06-30 VITALS — BP 104/62 | HR 62 | Temp 99.3°F | Ht 63.0 in | Wt 134.0 lb

## 2019-06-30 DIAGNOSIS — J40 Bronchitis, not specified as acute or chronic: Secondary | ICD-10-CM | POA: Diagnosis not present

## 2019-06-30 MED ORDER — AZITHROMYCIN 250 MG PO TABS: ORAL_TABLET | ORAL | 0 refills | Status: AC

## 2019-06-30 NOTE — Progress Notes (Signed)
Date:  06/30/2019   Name:  Darlene Newman   DOB:  06/21/1961   MRN:  [id number]   Chief Complaint: Cough (Dry cough, fever, chills, headache, body aches. Scratchy throat, and ears feel like clogged up. )  Cough This is a new problem. The current episode started in the past 7 days. The problem has been unchanged. The problem occurs every few minutes. The cough is non-productive. Associated symptoms include ear pain, a fever and a sore throat. Pertinent negatives include no chest pain, chills, headaches, myalgias, shortness of breath or wheezing. She has tried nothing for the symptoms. There is no history of environmental allergies. tested negative for Covid x 2    Review of Systems  Constitutional: Positive for fever. Negative for chills and unexpected weight change.  HENT: Positive for ear pain and sore throat.   Respiratory: Positive for cough and chest tightness. Negative for shortness of breath and wheezing.   Cardiovascular: Negative for chest pain and palpitations.  Musculoskeletal: Negative for myalgias.  Allergic/Immunologic: Negative for environmental allergies and immunocompromised state.  Neurological: Negative for dizziness, light-headedness and headaches.  Psychiatric/Behavioral: Negative for sleep disturbance.    Patient Active Problem List   Diagnosis Date Noted  . Localized swelling of right foot 01/21/2019  . Degenerative cervical disc 06/12/2017  . Hypothyroidism due to acquired atrophy of thyroid 11/24/2016  . Renal insufficiency 11/24/2016  . Impingement syndrome of right shoulder 05/20/2015  . Hypertriglyceridemia 02/17/2015  . Benign hypertension 02/17/2015  . Climacteric 02/17/2015  . History of fundoplication 02/17/2015  . Tobacco use disorder, mild, in sustained remission 02/17/2015  . Dermatitis contusiformis 05/31/2012  . Acid reflux 05/24/2012  . Lung mass 08/31/2011    Allergies  Allergen Reactions  . Ibuprofen     - kidney function low so  don't take no more.     Past Surgical History:  Procedure Laterality Date  . COLONOSCOPY  2015  . COLONOSCOPY WITH ESOPHAGOGASTRODUODENOSCOPY (EGD)  2015  . GASTRIC FUNDOPLICATION    . HIATAL HERNIA REPAIR  2015  . TOTAL VAGINAL HYSTERECTOMY  2012    Social History   Tobacco Use  . Smoking status: Former Smoker    Packs/day: 1.50    Years: 20.00    Pack years: 30.00    Types: Cigarettes    Quit date: 10/30/2006    Years since quitting: 12.6  . Smokeless tobacco: Never Used  Substance Use Topics  . Alcohol use: No    Alcohol/week: 0.0 standard drinks  . Drug use: No     Medication list has been reviewed and updated.  Current Meds  Medication Sig  . dicyclomine (BENTYL) 10 MG capsule Take 1 capsule (10 mg total) by mouth 4 (four) times daily -  before meals and at bedtime.  Marland Kitchen. levothyroxine (SYNTHROID, LEVOTHROID) 50 MCG tablet TAKE 1 TABLET(50 MCG) BY MOUTH DAILY BEFORE BREAKFAST  . losartan (COZAAR) 50 MG tablet TAKE 1 TABLET(50 MG) BY MOUTH DAILY  . metoprolol tartrate (LOPRESSOR) 50 MG tablet TAKE 1 TABLET(50 MG) BY MOUTH TWICE DAILY  . omeprazole (PRILOSEC) 40 MG capsule TAKE 1 CAPSULE(40 MG) BY MOUTH DAILY  . PARoxetine (PAXIL) 20 MG tablet TAKE 1 TABLET BY MOUTH ONCE DAILY.  . simvastatin (ZOCOR) 40 MG tablet TAKE 1 TABLET(40 MG) BY MOUTH DAILY    PHQ 2/9 Scores 06/30/2019 01/21/2019 06/08/2016  PHQ - 2 Score 0 0 0    BP Readings from Last 3 Encounters:  06/30/19 104/62  03/25/19 116/70  01/21/19 128/64    Physical Exam Vitals signs and nursing note reviewed.  Constitutional:      General: She is not in acute distress.    Appearance: Normal appearance. She is well-developed.  HENT:     Head: Normocephalic and atraumatic.     Right Ear: Tympanic membrane is retracted. Tympanic membrane is not erythematous.     Left Ear: Tympanic membrane is not erythematous or retracted.     Nose:     Right Sinus: No maxillary sinus tenderness.     Left Sinus: No  maxillary sinus tenderness.     Mouth/Throat:     Pharynx: No posterior oropharyngeal erythema.  Neck:     Musculoskeletal: Normal range of motion.  Cardiovascular:     Rate and Rhythm: Normal rate and regular rhythm.     Heart sounds: No murmur.  Pulmonary:     Effort: Pulmonary effort is normal. No respiratory distress.     Breath sounds: Normal breath sounds.     Comments: Harsh dry cough noted Musculoskeletal: Normal range of motion.  Lymphadenopathy:     Cervical: No cervical adenopathy.  Skin:    General: Skin is warm and dry.     Findings: No rash.  Neurological:     Mental Status: She is alert and oriented to person, place, and time.  Psychiatric:        Behavior: Behavior normal.        Thought Content: Thought content normal.     Wt Readings from Last 3 Encounters:  06/30/19 134 lb (60.8 kg)  03/25/19 134 lb 12.8 oz (61.1 kg)  01/21/19 136 lb (61.7 kg)    BP 104/62   Pulse 62   Temp 99.3 F (37.4 C) (Oral)   Ht 5\' 3"  (1.6 m)   Wt 134 lb (60.8 kg)   SpO2 98%   BMI 23.74 kg/m   Assessment and Plan: 1. Bronchitis With Covid negative x 2 suspect atypical infection in a past smoker Continue tylenol, fluids Will do paperwork to excuse her from work last week and this week - azithromycin (ZITHROMAX Z-PAK) 250 MG tablet; UAD  Dispense: 6 each; Refill: 0   Partially dictated using [employer]. Any errors are unintentional.  Halina Maidens, MD [facility]  06/30/2019

## 2019-07-01 ENCOUNTER — Telehealth: Payer: Self-pay | Admitting: *Deleted

## 2019-07-01 NOTE — Telephone Encounter (Signed)
Pt has been made aware of upcoming appts for follow up CT scan and follow up appt with Jennifer Burns, NP in the Lung Nodule Clinic. Pt verbalized understanding. Nothing further needed at this time. Appts mailed per pt request. 

## 2019-07-04 ENCOUNTER — Encounter: Payer: Self-pay | Admitting: Internal Medicine

## 2019-07-05 ENCOUNTER — Other Ambulatory Visit: Payer: Self-pay | Admitting: Internal Medicine

## 2019-07-05 DIAGNOSIS — I10 Essential (primary) hypertension: Secondary | ICD-10-CM

## 2019-07-05 DIAGNOSIS — E034 Atrophy of thyroid (acquired): Secondary | ICD-10-CM

## 2019-09-27 ENCOUNTER — Other Ambulatory Visit: Payer: Self-pay | Admitting: Internal Medicine

## 2019-09-27 DIAGNOSIS — K219 Gastro-esophageal reflux disease without esophagitis: Secondary | ICD-10-CM

## 2019-11-09 ENCOUNTER — Other Ambulatory Visit: Payer: Self-pay | Admitting: Internal Medicine

## 2019-11-09 DIAGNOSIS — I10 Essential (primary) hypertension: Secondary | ICD-10-CM

## 2020-01-11 ENCOUNTER — Other Ambulatory Visit: Payer: Self-pay | Admitting: Internal Medicine

## 2020-01-11 DIAGNOSIS — I10 Essential (primary) hypertension: Secondary | ICD-10-CM

## 2020-01-11 DIAGNOSIS — E034 Atrophy of thyroid (acquired): Secondary | ICD-10-CM

## 2020-01-22 ENCOUNTER — Ambulatory Visit (INDEPENDENT_AMBULATORY_CARE_PROVIDER_SITE_OTHER): Payer: Commercial Managed Care - PPO | Admitting: Internal Medicine

## 2020-01-22 ENCOUNTER — Other Ambulatory Visit: Payer: Self-pay

## 2020-01-22 ENCOUNTER — Encounter: Payer: Self-pay | Admitting: Internal Medicine

## 2020-01-22 VITALS — BP 120/72 | HR 54 | Temp 97.1°F | Ht 64.0 in | Wt 134.0 lb

## 2020-01-22 DIAGNOSIS — R059 Cough, unspecified: Secondary | ICD-10-CM

## 2020-01-22 DIAGNOSIS — I700 Atherosclerosis of aorta: Secondary | ICD-10-CM | POA: Insufficient documentation

## 2020-01-22 DIAGNOSIS — I10 Essential (primary) hypertension: Secondary | ICD-10-CM | POA: Diagnosis not present

## 2020-01-22 DIAGNOSIS — E034 Atrophy of thyroid (acquired): Secondary | ICD-10-CM

## 2020-01-22 DIAGNOSIS — R05 Cough: Secondary | ICD-10-CM

## 2020-01-22 DIAGNOSIS — E781 Pure hyperglyceridemia: Secondary | ICD-10-CM

## 2020-01-22 DIAGNOSIS — Z0000 Encounter for general adult medical examination without abnormal findings: Secondary | ICD-10-CM | POA: Diagnosis not present

## 2020-01-22 DIAGNOSIS — Z1231 Encounter for screening mammogram for malignant neoplasm of breast: Secondary | ICD-10-CM | POA: Diagnosis not present

## 2020-01-22 NOTE — Patient Instructions (Addendum)
Use Asmanex - 1 puff twice a day and rinse mouth after use  Covid Vaccine phone numbers: [facility] [phone] [place] [phone]  [email or web address]

## 2020-01-22 NOTE — Progress Notes (Signed)
Date:  01/22/2020   Name:  Darlene Newman   DOB:  12/23/1960   MRN:  [id number]   Chief Complaint: Annual Exam (Breast Exam. No Pap) Darlene Newman is a 59 y.o. female who presents today for her Complete Annual Exam. She feels well. She reports exercising none. She reports she is sleeping fairly well. She denies breast issues and is overdue for mammogram.  Mammogram 07/2017 Pap discontinued Colonoscopy  10/2013 Immunization History  Administered Date(s) Administered  . Influenza-Unspecified 07/05/2015, 08/13/2017    Hypertension The problem is controlled. Pertinent negatives include no chest pain, headaches, palpitations or shortness of breath. Past treatments include beta blockers and angiotensin blockers. The current treatment provides significant improvement. Identifiable causes of hypertension include a thyroid problem.  Hyperlipidemia The problem is controlled. Pertinent negatives include no chest pain or shortness of breath. Current antihyperlipidemic treatment includes statins. The current treatment provides significant improvement of lipids. There are no compliance problems.   Thyroid Problem Presents for follow-up visit. Patient reports no anxiety, constipation, diarrhea, fatigue, palpitations or tremors. The symptoms have been stable. Her past medical history is significant for hyperlipidemia.    Lab Results  Component Value Date   CREATININE 0.92 01/21/2019   BUN 15 01/21/2019   NA 143 01/21/2019   K 3.8 01/21/2019   CL 103 01/21/2019   CO2 24 01/21/2019   Lab Results  Component Value Date   CHOL 172 01/21/2019   HDL 38 (L) 01/21/2019   LDLCALC 101 (H) 01/21/2019   TRIG 167 (H) 01/21/2019   CHOLHDL 4.5 (H) 01/21/2019   Lab Results  Component Value Date   TSH 4.400 01/21/2019   No results found for: HGBA1C Lab Results  Component Value Date   WBC 6.2 01/21/2019   HGB 12.4 01/21/2019   HCT 36.2 01/21/2019   MCV 98 (H) 01/21/2019   PLT 219 01/21/2019     Lab Results  Component Value Date   ALT 30 01/21/2019   AST 27 01/21/2019   ALKPHOS 114 01/21/2019   BILITOT 0.4 01/21/2019     Review of Systems  Constitutional: Negative for chills, fatigue and fever.  HENT: Negative for congestion, hearing loss, tinnitus, trouble swallowing and voice change.   Eyes: Negative for visual disturbance.  Respiratory: Positive for cough. Negative for chest tightness, shortness of breath and wheezing.   Cardiovascular: Negative for chest pain, palpitations and leg swelling.  Gastrointestinal: Negative for abdominal pain, constipation, diarrhea and vomiting.  Endocrine: Negative for polydipsia and polyuria.  Genitourinary: Negative for dysuria, frequency, genital sores, vaginal bleeding and vaginal discharge.  Musculoskeletal: Negative for arthralgias, gait problem and joint swelling.  Skin: Negative for color change and rash.  Neurological: Negative for dizziness, tremors, light-headedness and headaches.  Hematological: Negative for adenopathy. Does not bruise/bleed easily.  Psychiatric/Behavioral: Negative for dysphoric mood and sleep disturbance. The patient is not nervous/anxious.     Patient Active Problem List   Diagnosis Date Noted  . Atherosclerosis of aorta (HCC) 01/22/2020  . Localized swelling of right foot 01/21/2019  . Degenerative cervical disc 06/12/2017  . Hypothyroidism due to acquired atrophy of thyroid 11/24/2016  . Renal insufficiency 11/24/2016  . Impingement syndrome of right shoulder 05/20/2015  . Hypertriglyceridemia 02/17/2015  . Benign hypertension 02/17/2015  . Climacteric 02/17/2015  . History of fundoplication 02/17/2015  . Tobacco use disorder, mild, in sustained remission 02/17/2015  . Dermatitis contusiformis 05/31/2012  . Acid reflux 05/24/2012  . Lung mass 08/31/2011    Allergies  Allergen Reactions  . Ibuprofen     - kidney function low so don't take no more.     Past Surgical History:  Procedure  Laterality Date  . COLONOSCOPY  2015  . COLONOSCOPY WITH ESOPHAGOGASTRODUODENOSCOPY (EGD)  2015  . GASTRIC FUNDOPLICATION    . HIATAL HERNIA REPAIR  2015  . TOTAL VAGINAL HYSTERECTOMY  2012    Social History   Tobacco Use  . Smoking status: Former Smoker    Packs/day: 1.50    Years: 20.00    Pack years: 30.00    Types: Cigarettes    Quit date: 10/30/2006    Years since quitting: 13.2  . Smokeless tobacco: Never Used  Substance Use Topics  . Alcohol use: No    Alcohol/week: 0.0 standard drinks  . Drug use: No     Medication list has been reviewed and updated.  Current Meds  Medication Sig  . dicyclomine (BENTYL) 10 MG capsule TAKE 1 CAPSULE(10 MG) BY MOUTH FOUR TIMES DAILY BEFORE MEALS AND AT BEDTIME  . levothyroxine (SYNTHROID) 50 MCG tablet TAKE 1 TABLET(50 MCG) BY MOUTH DAILY BEFORE BREAKFAST  . losartan (COZAAR) 50 MG tablet TAKE 1 TABLET(50 MG) BY MOUTH DAILY  . metoprolol tartrate (LOPRESSOR) 50 MG tablet TAKE 1 TABLET(50 MG) BY MOUTH TWICE DAILY  . omeprazole (PRILOSEC) 40 MG capsule TAKE 1 CAPSULE(40 MG) BY MOUTH DAILY  . PARoxetine (PAXIL) 20 MG tablet TAKE 1 TABLET BY MOUTH EVERY DAY  . simvastatin (ZOCOR) 40 MG tablet TAKE 1 TABLET(40 MG) BY MOUTH DAILY    PHQ 2/9 Scores 01/22/2020 06/30/2019 01/21/2019 06/08/2016  PHQ - 2 Score 0 0 0 0  PHQ- 9 Score 0 - - -    BP Readings from Last 3 Encounters:  01/22/20 120/72  06/30/19 104/62  03/25/19 116/70    Physical Exam Vitals and nursing note reviewed.  Constitutional:      General: She is not in acute distress.    Appearance: She is well-developed.  HENT:     Head: Normocephalic and atraumatic.     Right Ear: Tympanic membrane and ear canal normal.     Left Ear: Tympanic membrane and ear canal normal.     Nose:     Right Sinus: No maxillary sinus tenderness.     Left Sinus: No maxillary sinus tenderness.  Eyes:     General: No scleral icterus.       Right eye: No discharge.        Left eye: No  discharge.     Conjunctiva/sclera: Conjunctivae normal.  Neck:     Thyroid: No thyromegaly.     Vascular: No carotid bruit.  Cardiovascular:     Rate and Rhythm: Normal rate and regular rhythm.     Pulses: Normal pulses.     Heart sounds: Normal heart sounds.  Pulmonary:     Effort: Pulmonary effort is normal. No respiratory distress.     Breath sounds: Normal breath sounds and air entry. No decreased breath sounds, wheezing or rhonchi.  Chest:     Breasts:        Right: No mass, nipple discharge, skin change or tenderness.        Left: No mass, nipple discharge, skin change or tenderness.  Abdominal:     General: Bowel sounds are normal.     Palpations: Abdomen is soft.     Tenderness: There is no abdominal tenderness.  Musculoskeletal:        General: Normal range of  motion.     Cervical back: Normal range of motion. No erythema.  Lymphadenopathy:     Cervical: No cervical adenopathy.  Skin:    General: Skin is warm and dry.     Findings: No rash.  Neurological:     Mental Status: She is alert and oriented to person, place, and time.     Cranial Nerves: No cranial nerve deficit.     Sensory: No sensory deficit.     Deep Tendon Reflexes: Reflexes are normal and symmetric.  Psychiatric:        Speech: Speech normal.        Behavior: Behavior normal.        Thought Content: Thought content normal.     Wt Readings from Last 3 Encounters:  01/22/20 134 lb (60.8 kg)  06/30/19 134 lb (60.8 kg)  03/25/19 134 lb 12.8 oz (61.1 kg)    BP 120/72   Pulse (!) 54   Temp (!) 97.1 F (36.2 C) (Temporal)   Ht 5\' 4"  (1.626 m)   Wt 134 lb (60.8 kg)   SpO2 100%   BMI 23.00 kg/m   Assessment and Plan: 1. Annual physical exam Normal exam - POCT urinalysis dipstick  2. Encounter for screening mammogram for breast cancer Schedule at [facility] - MM 3D SCREEN BREAST BILATERAL; Future  3. Benign hypertension Clinically stable exam with well controlled BP. Tolerating medications  without side effects at this time. Pt to continue current regimen and low sodium diet; benefits of regular exercise as able discussed. - CBC with Differential/Platelet - Comprehensive metabolic panel  4. Hypothyroidism due to acquired atrophy of thyroid supplemented - TSH + free T4  5. Hypertriglyceridemia Tolerating statin medication without side effects at this time LDL is at goal of < 70 on current dose Continue same therapy without change at this time. - Lipid panel  6. Atherosclerosis of aorta (HCC) On Statin therapy Remains tobacco free  7. Cough May be sequelae of years of smoking Will try Asmanex 100 - one puff bid   Partially dictated using [facility]. Any errors are unintentional.  Animal nutritionist, MD [facility]  01/22/2020

## 2020-01-23 LAB — LIPID PANEL
Chol/HDL Ratio: 4.6 ratio — ABNORMAL HIGH (ref 0.0–4.4)
Cholesterol, Total: 180 mg/dL (ref 100–199)
HDL: 39 mg/dL — ABNORMAL LOW (ref >39)
LDL Chol Calc (NIH): 111 mg/dL — ABNORMAL HIGH (ref 0–99)
Triglycerides: 173 mg/dL — ABNORMAL HIGH (ref 0–149)
VLDL Cholesterol Cal: 30 mg/dL (ref 5–40)

## 2020-01-23 LAB — CBC WITH DIFFERENTIAL/PLATELET
Basophils Absolute: 0 10*3/uL (ref 0.0–0.2)
Basos: 1 %
EOS (ABSOLUTE): 0.1 10*3/uL (ref 0.0–0.4)
Eos: 2 %
Hematocrit: 36.1 % (ref 34.0–46.6)
Hemoglobin: 12.1 g/dL (ref 11.1–15.9)
Immature Grans (Abs): 0 10*3/uL (ref 0.0–0.1)
Immature Granulocytes: 0 %
Lymphocytes Absolute: 1.3 10*3/uL (ref 0.7–3.1)
Lymphs: 22 %
MCH: 32.4 pg (ref 26.6–33.0)
MCHC: 33.5 g/dL (ref 31.5–35.7)
MCV: 97 fL (ref 79–97)
Monocytes Absolute: 0.6 10*3/uL (ref 0.1–0.9)
Monocytes: 10 %
Neutrophils Absolute: 4 10*3/uL (ref 1.4–7.0)
Neutrophils: 65 %
Platelets: 185 10*3/uL (ref 150–450)
RBC: 3.74 x10E6/uL — ABNORMAL LOW (ref 3.77–5.28)
RDW: 13 % (ref 11.7–15.4)
WBC: 6.1 10*3/uL (ref 3.4–10.8)

## 2020-01-23 LAB — COMPREHENSIVE METABOLIC PANEL
ALT: 31 IU/L (ref 0–32)
AST: 28 IU/L (ref 0–40)
Albumin/Globulin Ratio: 1.9 (ref 1.2–2.2)
Albumin: 4.4 g/dL (ref 3.8–4.9)
Alkaline Phosphatase: 112 IU/L (ref 39–117)
BUN/Creatinine Ratio: 22 (ref 9–23)
BUN: 18 mg/dL (ref 6–24)
Bilirubin Total: 0.4 mg/dL (ref 0.0–1.2)
CO2: 26 mmol/L (ref 20–29)
Calcium: 9.6 mg/dL (ref 8.7–10.2)
Chloride: 103 mmol/L (ref 96–106)
Creatinine, Ser: 0.83 mg/dL (ref 0.57–1.00)
GFR calc Af Amer: 91 mL/min/{1.73_m2} (ref >59)
GFR calc non Af Amer: 79 mL/min/{1.73_m2} (ref >59)
Globulin, Total: 2.3 g/dL (ref 1.5–4.5)
Glucose: 97 mg/dL (ref 65–99)
Potassium: 4.2 mmol/L (ref 3.5–5.2)
Sodium: 142 mmol/L (ref 134–144)
Total Protein: 6.7 g/dL (ref 6.0–8.5)

## 2020-01-23 LAB — TSH+FREE T4
Free T4: 1.23 ng/dL (ref 0.82–1.77)
TSH: 4.16 u[IU]/mL (ref 0.450–4.500)

## 2020-02-16 ENCOUNTER — Telehealth: Payer: Self-pay

## 2020-02-16 NOTE — Telephone Encounter (Signed)
Pt called complaining of severe sciatic nerve pain down her leg. She said its been chronic since Thursday. She is not sleeping, and having difficulty working due to this.   Recommended [facility] walk-in clinic. Told her they open at 1pm. She verbalized understanding.  CM

## 2020-02-19 ENCOUNTER — Ambulatory Visit
Admission: RE | Admit: 2020-02-19 | Discharge: 2020-02-19 | Disposition: A | Discharge status: Home / Self Care | Payer: Commercial Managed Care - PPO | Source: Ambulatory Visit | Attending: Internal Medicine | Admitting: Internal Medicine

## 2020-02-19 DIAGNOSIS — Z1231 Encounter for screening mammogram for malignant neoplasm of breast: Secondary | ICD-10-CM | POA: Diagnosis present

## 2020-03-09 ENCOUNTER — Other Ambulatory Visit: Payer: Self-pay

## 2020-03-09 ENCOUNTER — Ambulatory Visit
Admission: RE | Admit: 2020-03-09 | Discharge: 2020-03-09 | Disposition: A | Discharge status: Home / Self Care | Payer: Commercial Managed Care - PPO | Source: Ambulatory Visit | Attending: Hospice and Palliative Medicine | Admitting: Hospice and Palliative Medicine

## 2020-03-09 ENCOUNTER — Other Ambulatory Visit: Payer: Self-pay | Admitting: Internal Medicine

## 2020-03-09 DIAGNOSIS — R911 Solitary pulmonary nodule: Secondary | ICD-10-CM | POA: Diagnosis present

## 2020-03-09 DIAGNOSIS — E781 Pure hyperglyceridemia: Secondary | ICD-10-CM

## 2020-03-09 NOTE — Telephone Encounter (Signed)
Requested Prescriptions  Pending Prescriptions Disp Refills  . simvastatin (ZOCOR) 40 MG tablet [Pharmacy Med Name: SIMVASTATIN 40MG  TABLETS] 30 tablet 12    Sig: TAKE 1 TABLET(40 MG) BY MOUTH DAILY     Cardiovascular:  Antilipid - Statins Failed - 03/09/2020  3:22 AM      Failed - LDL in normal range and within 360 days    LDL Chol Calc (NIH)  Date Value Ref Range Status  01/22/2020 111 (H) 0 - 99 mg/dL Final         Failed - HDL in normal range and within 360 days    HDL  Date Value Ref Range Status  01/22/2020 39 (L) >39 mg/dL Final         Failed - Triglycerides in normal range and within 360 days    Triglycerides  Date Value Ref Range Status  01/22/2020 173 (H) 0 - 149 mg/dL Final         Passed - Total Cholesterol in normal range and within 360 days    Cholesterol, Total  Date Value Ref Range Status  01/22/2020 180 100 - 199 mg/dL Final         Passed - Patient is not pregnant      Passed - Valid encounter within last 12 months    Recent Outpatient Visits          1 month ago Annual physical exam   [facility], [place]   8 months ago Bronchitis   [facility], [place]   1 year ago Annual physical exam   [facility], [place]   2 years ago Annual physical exam   [facility], [place]   2 years ago Benign hypertension   [facility] [place]      Future Appointments            In 4 months Reubin Milan Judithann Graves, MD [facility], [facility]   In 10 months [facility] Judithann Graves, MD [facility], [facility]

## 2020-03-10 ENCOUNTER — Inpatient Hospital Stay: Payer: Commercial Managed Care - PPO | Attending: Oncology | Admitting: Oncology

## 2020-03-10 DIAGNOSIS — N289 Disorder of kidney and ureter, unspecified: Secondary | ICD-10-CM | POA: Insufficient documentation

## 2020-03-10 DIAGNOSIS — K219 Gastro-esophageal reflux disease without esophagitis: Secondary | ICD-10-CM | POA: Insufficient documentation

## 2020-03-10 DIAGNOSIS — F329 Major depressive disorder, single episode, unspecified: Secondary | ICD-10-CM | POA: Diagnosis not present

## 2020-03-10 DIAGNOSIS — I2510 Atherosclerotic heart disease of native coronary artery without angina pectoris: Secondary | ICD-10-CM | POA: Insufficient documentation

## 2020-03-10 DIAGNOSIS — E039 Hypothyroidism, unspecified: Secondary | ICD-10-CM | POA: Insufficient documentation

## 2020-03-10 DIAGNOSIS — E785 Hyperlipidemia, unspecified: Secondary | ICD-10-CM | POA: Insufficient documentation

## 2020-03-10 DIAGNOSIS — I10 Essential (primary) hypertension: Secondary | ICD-10-CM | POA: Insufficient documentation

## 2020-03-10 DIAGNOSIS — Z87891 Personal history of nicotine dependence: Secondary | ICD-10-CM | POA: Insufficient documentation

## 2020-03-10 DIAGNOSIS — R911 Solitary pulmonary nodule: Secondary | ICD-10-CM | POA: Insufficient documentation

## 2020-03-10 NOTE — Progress Notes (Signed)
Pulmonary Nodule Clinic Consult note [facility]  [phone] [phone]  Patient Care Team: Reubin Milan, MD as PCP - General (Family Medicine)   Name of the patient: Crystalynn Mcinerney  [id number]  1961/01/08   Date of visit: 03/10/2020   Diagnosis- Lung Nodule  Chief complaint/ Reason for visit- Pulmonary Nodule Clinic Initial Visit  Past Medical History:  Patient is managed/referred by Dr. Judithann Graves.   Mrs. Morrical is a 59 year old female with past medical history significant for hypertension, acid reflux, renal insufficiency, hypothyroidism with recent discovery of pulmonary nodule.  She was assessed by Dr. Judithann Graves on 01/21/2019 for annual physical and noted several CT chest from November 2012 and 2014 that measured up to 5 mm in size.  Repeat imaging was recommended.  She was referred to the pulmonary nodule clinic.  Repeat CT chest without contrast completed on 03/21/2019 showed pulmonary nodules measuring 5 mm or less in size.  24-month follow-up was recommended.  Interval history-Mrs. Robon presents to the lung nodule clinic to discuss recent CT scan and results.  She was seen in clinic last year on 03/25/2019 by Sharia Reeve Borders to discuss findings of previous CT scan.  She denied any acute issues.  Admitted to a chronic nonproductive cough him she is followed by PCP and her pulmonologist.  She is a current non-smoker.  She smoked on and off for about 20 years approximately 1 pack/day when smoking.   She has family history significant for heart problems and diabetes.  No cancers.  She has no personal history of cancer.  She continues to complain of chronic intermittent cough that is nonproductive.  She states that the follow-up of her lung nodules was intended to possibly help resolve her chronic cough.  Otherwise, she is healthy.  She is very active.  She was recently started on a new inhaler to help with her cough.  Previously tried albuterol  but discontinued secondary to tachycardia.  Discussed previous work history.  Mrs. Nuno worked in [employer] for greater than 20 years of her life.  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  She has been with them for about 12 years.  She works third shift.  ECOG FS:1 - Symptomatic but completely ambulatory  Review of systems- Review of Systems  Constitutional: Negative.  Negative for chills, fever, malaise/fatigue and weight loss.  HENT: Negative for congestion, ear pain and tinnitus.   Eyes: Negative.  Negative for blurred vision and double vision.  Respiratory: Positive for cough. Negative for sputum production and shortness of breath.   Cardiovascular: Negative.  Negative for chest pain, palpitations and leg swelling.  Gastrointestinal: Negative.  Negative for abdominal pain, constipation, diarrhea, nausea and vomiting.  Genitourinary: Negative for dysuria, frequency and urgency.  Musculoskeletal: Negative for back pain and falls.  Skin: Negative.  Negative for rash.  Neurological: Negative.  Negative for weakness and headaches.  Endo/Heme/Allergies: Negative.  Does not bruise/bleed easily.  Psychiatric/Behavioral: Negative.  Negative for depression. The patient is not nervous/anxious and does not have insomnia.      Allergies  Allergen Reactions  . Ibuprofen     - kidney function low so don't take no more.      Past Medical History:  Diagnosis Date  . Acute cystitis   . CAD (coronary artery disease)   . Chronic right shoulder pain   . Depression   . GERD (gastroesophageal  reflux disease)   . Hyperlipidemia   . Hypertension   . Lung mass      Past Surgical History:  Procedure Laterality Date  . COLONOSCOPY  2015  . COLONOSCOPY WITH ESOPHAGOGASTRODUODENOSCOPY (EGD)  2015  . GASTRIC FUNDOPLICATION    . HIATAL HERNIA REPAIR  2015  . TOTAL VAGINAL HYSTERECTOMY  2012    Social History     Socioeconomic History  . Marital status: Single    Spouse name: Not on file  . Number of children: Not on file  . Years of education: Not on file  . Highest education level: Not on file  Occupational History  . Not on file  Tobacco Use  . Smoking status: Former Smoker    Packs/day: 1.50    Years: 20.00    Pack years: 30.00    Types: Cigarettes    Quit date: 10/30/2006    Years since quitting: 13.3  . Smokeless tobacco: Never Used  Substance and Sexual Activity  . Alcohol use: No    Alcohol/week: 0.0 standard drinks  . Drug use: No  . Sexual activity: Yes  Other Topics Concern  . Not on file  Social History Narrative  . Not on file   Social Determinants of Health   Financial Resource Strain:   . Difficulty of Paying Living Expenses:   Food Insecurity:   . Worried About Programme researcher, broadcasting/film/video in the Last Year:   . Barista in the Last Year:   Transportation Needs:   . Freight forwarder (Medical):   Marland Kitchen Lack of Transportation (Non-Medical):   Physical Activity:   . Days of Exercise per Week:   . Minutes of Exercise per Session:   Stress:   . Feeling of Stress :   Social Connections:   . Frequency of Communication with Friends and Family:   . Frequency of Social Gatherings with Friends and Family:   . Attends Religious Services:   . Active Member of Clubs or Organizations:   . Attends Banker Meetings:   Marland Kitchen Marital Status:   Intimate Partner Violence:   . Fear of Current or Ex-Partner:   . Emotionally Abused:   Marland Kitchen Physically Abused:   . Sexually Abused:     Family History  Problem Relation Age of Onset  . Diabetes Mother   . Heart disease Mother   . Diabetes Sister   . Breast cancer Neg Hx      Current Outpatient Medications:  .  dicyclomine (BENTYL) 10 MG capsule, TAKE 1 CAPSULE(10 MG) BY MOUTH FOUR TIMES DAILY BEFORE MEALS AND AT BEDTIME, Disp: 120 capsule, Rfl: 5 .  levothyroxine (SYNTHROID) 50 MCG tablet, TAKE 1 TABLET(50 MCG) BY  MOUTH DAILY BEFORE BREAKFAST, Disp: 30 tablet, Rfl: 5 .  losartan (COZAAR) 50 MG tablet, TAKE 1 TABLET(50 MG) BY MOUTH DAILY, Disp: 30 tablet, Rfl: 5 .  metoprolol tartrate (LOPRESSOR) 50 MG tablet, TAKE 1 TABLET(50 MG) BY MOUTH TWICE DAILY, Disp: 60 tablet, Rfl: 5 .  omeprazole (PRILOSEC) 40 MG capsule, TAKE 1 CAPSULE(40 MG) BY MOUTH DAILY, Disp: 30 capsule, Rfl: 12 .  PARoxetine (PAXIL) 20 MG tablet, TAKE 1 TABLET BY MOUTH EVERY DAY, Disp: 30 tablet, Rfl: 5 .  simvastatin (ZOCOR) 40 MG tablet, TAKE 1 TABLET(40 MG) BY MOUTH DAILY, Disp: 90 tablet, Rfl: 3  Physical exam: There were no vitals filed for this visit. Physical Exam Constitutional:      Appearance: Normal appearance.  HENT:  Head: Normocephalic and atraumatic.  Eyes:     Pupils: Pupils are equal, round, and reactive to light.  Cardiovascular:     Rate and Rhythm: Normal rate and regular rhythm.     Heart sounds: Normal heart sounds. No murmur.  Pulmonary:     Effort: Pulmonary effort is normal.     Breath sounds: Normal breath sounds. No wheezing.  Abdominal:     General: Bowel sounds are normal. There is no distension.     Palpations: Abdomen is soft.     Tenderness: There is no abdominal tenderness.  Musculoskeletal:        General: Normal range of motion.     Cervical back: Normal range of motion.  Skin:    General: Skin is warm and dry.     Findings: No rash.  Neurological:     Mental Status: She is alert and oriented to person, place, and time.  Psychiatric:        Judgment: Judgment normal.      CMP Latest Ref Rng & Units 01/22/2020  Glucose 65 - 99 mg/dL 97  BUN 6 - 24 mg/dL 18  Creatinine 4.58 - 0.99 mg/dL 8.33  Sodium 825 - 053 mmol/L 142  Potassium 3.5 - 5.2 mmol/L 4.2  Chloride 96 - 106 mmol/L 103  CO2 20 - 29 mmol/L 26  Calcium 8.7 - 10.2 mg/dL 9.6  Total Protein 6.0 - 8.5 g/dL 6.7  Total Bilirubin 0.0 - 1.2 mg/dL 0.4  Alkaline Phos 39 - 117 IU/L 112  AST 0 - 40 IU/L 28  ALT 0 - 32 IU/L 31     CBC Latest Ref Rng & Units 01/22/2020  WBC 3.4 - 10.8 x10E3/uL 6.1  Hemoglobin 11.1 - 15.9 g/dL 97.6  Hematocrit 73.4 - 46.6 % 36.1  Platelets 150 - 450 x10E3/uL 185    No images are attached to the encounter.  CT Chest Wo Contrast  Result Date: 03/09/2020 CLINICAL DATA:  Pulmonary nodule follow up EXAM: CT CHEST WITHOUT CONTRAST TECHNIQUE: Multidetector CT imaging of the chest was performed following the standard protocol without IV contrast. COMPARISON:  03/21/2019 FINDINGS: Cardiovascular: Coronary, aortic arch, and branch vessel atherosclerotic vascular disease. Mediastinum/Nodes: Small hiatal hernia appears to contain a fundoplication. No pathologic adenopathy identified. Lungs/Pleura: Biapical pleuroparenchymal scarring. A variety of small pulmonary nodules in both lungs are stable. These include: 0.6 by 0.5 by 0.3 cm right lower lobe pulmonary nodule on image 70/3. 0.4 by 0.5 by 0.5 cm right lower lobe pulmonary nodule on image 55/3. 0.4 by 0.5 by 0.4 cm subpleural nodule along the major fissure in the right lower lobe on image 51/3. 0.4 by 0.3 by 0.4 cm right upper lobe nodule on image 28/3. 0.5 by 0.2 by 0.4 cm left upper lobe nodule on image 41/3. Upper Abdomen: Unremarkable Musculoskeletal: Mild thoracic kyphosis. IMPRESSION: 1. A variety of small pulmonary nodules in both lungs are stable and considered benign. No further workup required. 2. Coronary, aortic arch, and branch vessel atherosclerotic vascular disease. 3. Small hiatal hernia appears to contain a fundoplication. 4. Mild thoracic kyphosis. 5. Aortic atherosclerosis. Aortic Atherosclerosis (ICD10-I70.0). Electronically Signed   By: Gaylyn Rong M.D.   On: 03/09/2020 21:05   MM 3D SCREEN BREAST BILATERAL  Result Date: 02/19/2020 CLINICAL DATA:  Screening. EXAM: DIGITAL SCREENING BILATERAL MAMMOGRAM WITH TOMO AND CAD COMPARISON:  Previous exam(s). ACR Breast Density Category c: The breast tissue is heterogeneously dense,  which may obscure small masses. FINDINGS: There are no  findings suspicious for malignancy. Images were processed with CAD. IMPRESSION: No mammographic evidence of malignancy. A result letter of this screening mammogram will be mailed directly to the patient. RECOMMENDATION: Screening mammogram in one year. (Code:SM-B-01Y) BI-RADS CATEGORY  1: Negative. Electronically Signed   By: Audie Pinto M.D.   On: 02/19/2020 12:53     Assessment and plan- Patient is a 59 y.o. female who presents to pulmonary nodule clinic for follow-up of incidental lung nodules.    CT chest without contrast from 03/09/2020 showed a variety of small pulmonary nodules in both lungs which have been stable over the years.  They are considered benign and no further work-up is required.   Calculating malignancy probability of a pulmonary nodule: Risk factors include: 1.  Age. 2.  Cancer history. 3.  Diameter of pulmonary nodule and mm 4.  Location 5.  Smoking history 6.  Spiculation present   Based on risk factors, this patient is moderate risk for the development of lung cancer.  Given lung nodules have been stable dating back to 2012 in 2014, I do not believe she needs any additional follow-up in the lung nodule program. I would recommend follow-up with annual low-dose CT screening.    During our visit, we discussed pulmonary nodules are a common incidental finding and are often how lung cancer is discovered.  Lung cancer survival is directly related to the stage at diagnosis.  We discussed that nodules can vary in presentation from solitary pulmonary nodules to masses, 2 groundglass opacities and multiple nodules.  Pulmonary nodules in the majority of cases are benign but the probability of these becoming malignant cannot be undermined.  Early identification of malignant nodules could lead to early diagnosis and increased survival.   We discussed the probability of pulmonary nodules becoming malignant increase with age,  pack years of tobacco use, size/characteristics of the nodule and location; with upper lobe involvement being most worrisome.   We discussed the goal of our clinic is to thoroughly evaluate each nodule, developed a comprehensive, individualized plan of care utilizing the most advanced technology and significantly reduce the time from detection to treatment.  A dedicated pulmonary nodule clinic has proven to indeed expedite the detection and treatment of lung cancer.   Patient education in fact sheet provided along with most recent CT scans.  Plan: Review CT scan without contrast from 03/09/2020. Discussed family and personal history. Introduced lung screening program-referral sent Referral to pulmonology for chronic cough.   Disposition: No additional follow-up needed with the pulmonary nodule clinic. Follow-up with pulmonology and PCP.   Greater than 50% was spent in counseling and coordination of care with this patient including but not limited to discussion of the relevant topics above (See A&P) including, but not limited to diagnosis and management of acute and chronic medical conditions.   Faythe Casa, NP 03/10/2020 11:51 AM   CC: Dr. Army Melia

## 2020-03-27 ENCOUNTER — Other Ambulatory Visit: Payer: Self-pay | Admitting: Internal Medicine

## 2020-03-27 DIAGNOSIS — K219 Gastro-esophageal reflux disease without esophagitis: Secondary | ICD-10-CM

## 2020-03-27 NOTE — Telephone Encounter (Signed)
Requested Prescriptions  Pending Prescriptions Disp Refills  . dicyclomine (BENTYL) 10 MG capsule [Pharmacy Med Name: DICYCLOMINE 10MG  CAPSULES] 120 capsule 5    Sig: TAKE 1 CAPSULE(10 MG) BY MOUTH FOUR TIMES DAILY BEFORE MEALS AND AT BEDTIME     Gastroenterology:  Antispasmodic Agents Passed - 03/27/2020  3:22 AM      Passed - Last Heart Rate in normal range    Pulse Readings from Last 1 Encounters:  01/22/20 (!) 54         Passed - Valid encounter within last 12 months    Recent Outpatient Visits          2 months ago Annual physical exam   [facility]   9 months ago Bronchitis   [facility]   1 year ago Annual physical exam   [facility], [facility]   2 years ago Annual physical exam   [facility], [facility]   2 years ago Benign hypertension   [facility] [place]      Future Appointments            In 4 months Reubin Milan Judithann Graves, MD [facility], [facility]   In 10 months [facility] Judithann Graves, MD [facility], [facility]

## 2020-04-05 ENCOUNTER — Telehealth: Payer: Self-pay | Admitting: *Deleted

## 2020-04-05 NOTE — Telephone Encounter (Signed)
Contacted regarding lung screening referral. Reviewed lung screening imaging and smoking history. Will contact and schedule lung screening scan 1 year from last CT chest.

## 2020-05-05 ENCOUNTER — Institutional Professional Consult (permissible substitution): Payer: Commercial Managed Care - PPO | Admitting: Pulmonary Disease

## 2020-05-12 ENCOUNTER — Other Ambulatory Visit: Payer: Self-pay | Admitting: Internal Medicine

## 2020-05-12 DIAGNOSIS — I10 Essential (primary) hypertension: Secondary | ICD-10-CM

## 2020-05-12 NOTE — Telephone Encounter (Signed)
Requested Prescriptions  Pending Prescriptions Disp Refills  . losartan (COZAAR) 50 MG tablet [Pharmacy Med Name: LOSARTAN 50MG  TABLETS] 90 tablet 0    Sig: TAKE 1 TABLET(50 MG) BY MOUTH DAILY     Cardiovascular:  Angiotensin Receptor Blockers Passed - 05/12/2020  3:23 AM      Passed - Cr in normal range and within 180 days    Creatinine, Ser  Date Value Ref Range Status  01/22/2020 0.83 0.57 - 1.00 mg/dL Final         Passed - K in normal range and within 180 days    Potassium  Date Value Ref Range Status  01/22/2020 4.2 3.5 - 5.2 mmol/L Final         Passed - Patient is not pregnant      Passed - Last BP in normal range    BP Readings from Last 1 Encounters:  01/22/20 120/72         Passed - Valid encounter within last 6 months    Recent Outpatient Visits          3 months ago Annual physical exam   [facility]   10 months ago Bronchitis   [facility], [facility]   1 year ago Annual physical exam   [facility], [place]   2 years ago Annual physical exam   [facility]   2 years ago Benign hypertension   [facility] [place]      Future Appointments            In 2 months Reubin Milan Judithann Graves, MD [facility], [facility]   In 8 months [facility], Judithann Graves, MD [facility], [facility]

## 2020-05-26 ENCOUNTER — Other Ambulatory Visit: Payer: Self-pay | Admitting: Internal Medicine

## 2020-05-26 DIAGNOSIS — K219 Gastro-esophageal reflux disease without esophagitis: Secondary | ICD-10-CM

## 2020-05-26 NOTE — Telephone Encounter (Signed)
Requested Prescriptions  Pending Prescriptions Disp Refills   omeprazole (PRILOSEC) 40 MG capsule [Pharmacy Med Name: OMEPRAZOLE 40MG  CAPSULES] 90 capsule 2    Sig: TAKE 1 CAPSULE(40 MG) BY MOUTH DAILY     Gastroenterology: Proton Pump Inhibitors Passed - 05/26/2020  3:12 AM      Passed - Valid encounter within last 12 months    Recent Outpatient Visits          4 months ago Annual physical exam   [facility]   11 months ago Bronchitis   [facility]   1 year ago Annual physical exam   [facility]   2 years ago Annual physical exam   [facility]   2 years ago Benign hypertension   [facility] [place]      Future Appointments            In 2 months Reubin Milan Judithann Graves, MD [facility], [facility]   In 8 months [facility], Judithann Graves, MD [facility], [facility]

## 2020-05-31 ENCOUNTER — Ambulatory Visit: Payer: Self-pay

## 2020-05-31 NOTE — Telephone Encounter (Signed)
  Pt. Reports she started having sinus pain and pressure 4 days ago. No fever. Nose is blocked. No nasal drainage . Left ear feels full, uncomfortable. Did not work last night because she felt "so bad." No availability for virtual visit today. Pt. Wants to know what she can take OTC for her symptoms, safely with her medications she is on. Please advise. Reason for Disposition . Earache  Answer Assessment - Initial Assessment Questions 1. LOCATION: "Where does it hurt?"      Headache - left side 2. ONSET: "When did the sinus pain start?"  (e.g., hours, days)      4 days ago 3. SEVERITY: "How bad is the pain?"   (Scale 1-10; mild, moderate or severe)   - MILD (1-3): doesn't interfere with normal activities    - MODERATE (4-7): interferes with normal activities (e.g., work or school) or awakens from sleep   - SEVERE (8-10): excruciating pain and patient unable to do any normal activities        6-7 4. RECURRENT SYMPTOM: "Have you ever had sinus problems before?" If Yes, ask: "When was the last time?" and "What happened that time?"      Yes 5. NASAL CONGESTION: "Is the nose blocked?" If Yes, ask: "Can you open it or must you breathe through the mouth?"     Blocked 6. NASAL DISCHARGE: "Do you have discharge from your nose?" If so ask, "What color?"     None 7. FEVER: "Do you have a fever?" If Yes, ask: "What is it, how was it measured, and when did it start?"      No 8. OTHER SYMPTOMS: "Do you have any other symptoms?" (e.g., sore throat, cough, earache, difficulty breathing)     Dry cough, left ear pain 9. PREGNANCY: "Is there any chance you are pregnant?" "When was your last menstrual period?"     No  Protocols used: SINUS PAIN OR CONGESTION-A-AH

## 2020-06-01 ENCOUNTER — Other Ambulatory Visit: Payer: Self-pay

## 2020-06-01 ENCOUNTER — Encounter: Payer: Self-pay | Admitting: Family Medicine

## 2020-06-01 ENCOUNTER — Ambulatory Visit: Payer: Commercial Managed Care - PPO | Admitting: Family Medicine

## 2020-06-01 VITALS — BP 112/70 | HR 67 | Temp 98.2°F | Ht 63.0 in | Wt 137.0 lb

## 2020-06-01 DIAGNOSIS — J0100 Acute maxillary sinusitis, unspecified: Secondary | ICD-10-CM | POA: Diagnosis not present

## 2020-06-01 MED ORDER — AMOXICILLIN 500 MG PO CAPS: 500.0000 mg | ORAL_CAPSULE | Freq: Three times a day (TID) | ORAL | 0 refills | Status: DC

## 2020-06-01 NOTE — Progress Notes (Signed)
Date:  06/01/2020   Name:  Darlene Newman   DOB:  03/18/1961   MRN:  [id number]   Chief Complaint: Sinusitis (Drainage in throat, itchy throat, ear pain. Sinus pressure and headache on left side and behind left eye. No fever. Eyes itching and watering. )  Sinusitis This is a new problem. The current episode started in the past 7 days. The problem is unchanged. There has been no fever. The pain is mild. Associated symptoms include congestion, ear pain, a hoarse voice, sinus pressure, sneezing and a sore throat. Pertinent negatives include no chills, coughing, diaphoresis, headaches, neck pain, shortness of breath or swollen glands. (Postnasal drainage) Past treatments include acetaminophen and oral decongestants.    Lab Results  Component Value Date   CREATININE 0.83 01/22/2020   BUN 18 01/22/2020   NA 142 01/22/2020   K 4.2 01/22/2020   CL 103 01/22/2020   CO2 26 01/22/2020   Lab Results  Component Value Date   CHOL 180 01/22/2020   HDL 39 (L) 01/22/2020   LDLCALC 111 (H) 01/22/2020   TRIG 173 (H) 01/22/2020   CHOLHDL 4.6 (H) 01/22/2020   Lab Results  Component Value Date   TSH 4.160 01/22/2020   No results found for: HGBA1C Lab Results  Component Value Date   WBC 6.1 01/22/2020   HGB 12.1 01/22/2020   HCT 36.1 01/22/2020   MCV 97 01/22/2020   PLT 185 01/22/2020   Lab Results  Component Value Date   ALT 31 01/22/2020   AST 28 01/22/2020   ALKPHOS 112 01/22/2020   BILITOT 0.4 01/22/2020     Review of Systems  Constitutional: Negative.  Negative for chills, diaphoresis, fatigue, fever and unexpected weight change.  HENT: Positive for congestion, ear pain, hoarse voice, sinus pressure, sneezing and sore throat. Negative for ear discharge and rhinorrhea.   Eyes: Negative for photophobia, pain, discharge, redness and itching.  Respiratory: Negative for cough, shortness of breath, wheezing and stridor.   Gastrointestinal: Negative for abdominal pain, blood in  stool, constipation, diarrhea, nausea and vomiting.  Endocrine: Negative for cold intolerance, heat intolerance, polydipsia, polyphagia and polyuria.  Genitourinary: Negative for dysuria, flank pain, frequency, hematuria, menstrual problem, pelvic pain, urgency, vaginal bleeding and vaginal discharge.  Musculoskeletal: Negative for arthralgias, back pain, myalgias and neck pain.  Skin: Negative for rash.  Allergic/Immunologic: Negative for environmental allergies and food allergies.  Neurological: Negative for dizziness, weakness, light-headedness, numbness and headaches.  Hematological: Negative for adenopathy. Does not bruise/bleed easily.  Psychiatric/Behavioral: Negative for dysphoric mood. The patient is not nervous/anxious.     Patient Active Problem List   Diagnosis Date Noted  . Atherosclerosis of aorta (HCC) 01/22/2020  . Localized swelling of right foot 01/21/2019  . Degenerative cervical disc 06/12/2017  . Hypothyroidism due to acquired atrophy of thyroid 11/24/2016  . Renal insufficiency 11/24/2016  . Impingement syndrome of right shoulder 05/20/2015  . Hypertriglyceridemia 02/17/2015  . Benign hypertension 02/17/2015  . Climacteric 02/17/2015  . History of fundoplication 02/17/2015  . Tobacco use disorder, mild, in sustained remission 02/17/2015  . Dermatitis contusiformis 05/31/2012  . Acid reflux 05/24/2012  . Lung mass 08/31/2011    Allergies  Allergen Reactions  . Ibuprofen     - kidney function low so don't take no more.     Past Surgical History:  Procedure Laterality Date  . COLONOSCOPY  2015  . COLONOSCOPY WITH ESOPHAGOGASTRODUODENOSCOPY (EGD)  2015  . GASTRIC FUNDOPLICATION    . HIATAL  HERNIA REPAIR  2015  . TOTAL VAGINAL HYSTERECTOMY  2012    Social History   Tobacco Use  . Smoking status: Former Smoker    Packs/day: 1.50    Years: 20.00    Pack years: 30.00    Types: Cigarettes    Quit date: 10/30/2006    Years since quitting: 13.5  .  Smokeless tobacco: Never Used  Vaping Use  . Vaping Use: Never used  Substance Use Topics  . Alcohol use: No    Alcohol/week: 0.0 standard drinks  . Drug use: No     Medication list has been reviewed and updated.  Current Meds  Medication Sig  . dicyclomine (BENTYL) 10 MG capsule TAKE 1 CAPSULE(10 MG) BY MOUTH FOUR TIMES DAILY BEFORE MEALS AND AT BEDTIME  . levothyroxine (SYNTHROID) 50 MCG tablet TAKE 1 TABLET(50 MCG) BY MOUTH DAILY BEFORE BREAKFAST  . losartan (COZAAR) 50 MG tablet TAKE 1 TABLET(50 MG) BY MOUTH DAILY  . metoprolol tartrate (LOPRESSOR) 50 MG tablet TAKE 1 TABLET(50 MG) BY MOUTH TWICE DAILY  . omeprazole (PRILOSEC) 40 MG capsule TAKE 1 CAPSULE(40 MG) BY MOUTH DAILY  . PARoxetine (PAXIL) 20 MG tablet TAKE 1 TABLET BY MOUTH EVERY DAY  . simvastatin (ZOCOR) 40 MG tablet TAKE 1 TABLET(40 MG) BY MOUTH DAILY    PHQ 2/9 Scores 06/01/2020 01/22/2020 06/30/2019 01/21/2019  PHQ - 2 Score 0 0 0 0  PHQ- 9 Score 0 0 - -    No flowsheet data found.  BP Readings from Last 3 Encounters:  06/01/20 112/70  01/22/20 120/72  06/30/19 104/62    Physical Exam Vitals and nursing note reviewed.  Constitutional:      Appearance: She is well-developed.  HENT:     Head: Normocephalic.     Jaw: There is normal jaw occlusion.     Right Ear: Hearing, tympanic membrane, ear canal and external ear normal.     Left Ear: Hearing, tympanic membrane, ear canal and external ear normal.     Nose:     Right Turbinates: Swollen.     Left Turbinates: Swollen.     Right Sinus: No maxillary sinus tenderness or frontal sinus tenderness.     Left Sinus: Maxillary sinus tenderness and frontal sinus tenderness present.     Mouth/Throat:     Palate: No mass.     Pharynx: Oropharynx is clear. Uvula midline.  Eyes:     General: Lids are everted, no foreign bodies appreciated. No scleral icterus.       Left eye: No foreign body or hordeolum.     Conjunctiva/sclera: Conjunctivae normal.     Right  eye: Right conjunctiva is not injected.     Left eye: Left conjunctiva is not injected.     Pupils: Pupils are equal, round, and reactive to light.  Neck:     Thyroid: No thyroid mass or thyromegaly.     Vascular: Normal carotid pulses. No carotid bruit, hepatojugular reflux or JVD.     Trachea: No tracheal deviation.  Cardiovascular:     Rate and Rhythm: Normal rate and regular rhythm.     Heart sounds: Normal heart sounds. No murmur heard.  No friction rub. No gallop.   Pulmonary:     Effort: Pulmonary effort is normal. No respiratory distress.     Breath sounds: Normal breath sounds. No decreased breath sounds, wheezing, rhonchi or rales.  Abdominal:     General: Bowel sounds are normal.     Palpations: Abdomen  is soft. There is no mass.     Tenderness: There is no abdominal tenderness. There is no guarding or rebound.  Musculoskeletal:        General: No tenderness. Normal range of motion.     Cervical back: Full passive range of motion without pain, normal range of motion and neck supple.  Lymphadenopathy:     Cervical: No cervical adenopathy.     Right cervical: No superficial, deep or posterior cervical adenopathy.    Left cervical: No superficial, deep or posterior cervical adenopathy.  Skin:    General: Skin is warm.     Findings: No rash.  Neurological:     Mental Status: She is alert and oriented to person, place, and time.     Cranial Nerves: No cranial nerve deficit.     Deep Tendon Reflexes: Reflexes normal.  Psychiatric:        Mood and Affect: Mood is not anxious or depressed.     Wt Readings from Last 3 Encounters:  06/01/20 137 lb (62.1 kg)  01/22/20 134 lb (60.8 kg)  06/30/19 134 lb (60.8 kg)    BP 112/70   Pulse 67   Temp 98.2 F (36.8 C) (Oral)   Ht 5\' 3"  (1.6 m)   Wt 137 lb (62.1 kg)   SpO2 98%   BMI 24.27 kg/m   Assessment and Plan:  1. Acute non-recurrent maxillary sinusitis Acute.  Persistent.  Stable.  Continue amoxicillin 500 mg 3  times a day.  History and physical exam with tenderness over the left frontal and maxillary sinuses consistent with a left maxillary sinusitis.  Patient was also suggested to obtain Mucinex DM to help with sinus drainage as well as suppression of cough. - amoxicillin (AMOXIL) 500 MG capsule; Take 1 capsule (500 mg total) by mouth 3 (three) times daily.  Dispense: 30 capsule; Refill: 0

## 2020-06-16 ENCOUNTER — Institutional Professional Consult (permissible substitution): Payer: Commercial Managed Care - PPO | Admitting: Pulmonary Disease

## 2020-06-18 ENCOUNTER — Other Ambulatory Visit: Payer: Self-pay | Admitting: Physical Medicine & Rehabilitation

## 2020-06-18 ENCOUNTER — Other Ambulatory Visit (HOSPITAL_COMMUNITY): Payer: Self-pay | Admitting: Physical Medicine & Rehabilitation

## 2020-06-18 DIAGNOSIS — G8929 Other chronic pain: Secondary | ICD-10-CM

## 2020-06-18 DIAGNOSIS — M5442 Lumbago with sciatica, left side: Secondary | ICD-10-CM

## 2020-06-20 ENCOUNTER — Ambulatory Visit
Admission: RE | Admit: 2020-06-20 | Discharge: 2020-06-20 | Disposition: A | Discharge status: Home / Self Care | Payer: Commercial Managed Care - PPO | Source: Ambulatory Visit | Attending: Physical Medicine & Rehabilitation | Admitting: Physical Medicine & Rehabilitation

## 2020-06-20 DIAGNOSIS — G8929 Other chronic pain: Secondary | ICD-10-CM | POA: Diagnosis present

## 2020-06-20 DIAGNOSIS — M5442 Lumbago with sciatica, left side: Secondary | ICD-10-CM | POA: Insufficient documentation

## 2020-07-11 ENCOUNTER — Other Ambulatory Visit: Payer: Self-pay | Admitting: Internal Medicine

## 2020-07-11 DIAGNOSIS — E034 Atrophy of thyroid (acquired): Secondary | ICD-10-CM

## 2020-07-11 DIAGNOSIS — I10 Essential (primary) hypertension: Secondary | ICD-10-CM

## 2020-07-11 NOTE — Telephone Encounter (Signed)
Requested Prescriptions  Pending Prescriptions Disp Refills  . PARoxetine (PAXIL) 20 MG tablet [Pharmacy Med Name: PAROXETINE 20MG  TABLETS] 90 tablet 1    Sig: TAKE 1 TABLET BY MOUTH EVERY DAY     Psychiatry:  Antidepressants - SSRI Passed - 07/11/2020  9:05 AM      Passed - Valid encounter within last 6 months    Recent Outpatient Visits          1 month ago Acute non-recurrent maxillary sinusitis   [facility] 09/10/2020, MD   5 months ago Annual physical exam   [facility], MD   1 year ago Bronchitis   [facility], MD   1 year ago Annual physical exam   [facility], MD   2 years ago Annual physical exam   [facility], MD      Future Appointments            In 3 weeks Reubin Milan Judithann Graves, MD [facility]   In 6 months [facility] Judithann Graves, MD [facility]           . metoprolol tartrate (LOPRESSOR) 50 MG tablet [Pharmacy Med Name: METOPROLOL TARTRATE 50MG  TABLETS] 180 tablet 1    Sig: TAKE 1 TABLET(50 MG) BY MOUTH TWICE DAILY     Cardiovascular:  Beta Blockers Passed - 07/11/2020  9:05 AM      Passed - Last BP in normal range    BP Readings from Last 1 Encounters:  06/01/20 112/70         Passed - Last Heart Rate in normal range    Pulse Readings from Last 1 Encounters:  06/01/20 67         Passed - Valid encounter within last 6 months    Recent Outpatient Visits          1 month ago Acute non-recurrent maxillary sinusitis   [facility] 08/01/20, MD   5 months ago Annual physical exam   [facility] Duanne Limerick, MD   1 year ago Bronchitis   [facility] Reubin Milan, MD   1 year ago Annual physical exam   [facility] Reubin Milan, MD   2 years ago Annual physical exam   [facility] Reubin Milan, MD      Future Appointments             In 3 weeks [facility] Reubin Milan, MD [facility]   In 6 months Nyoka Cowden [facility], MD [facility]           . levothyroxine (SYNTHROID) 50 MCG tablet [Pharmacy Med Name: LEVOTHYROXINE 0.05MG  (Nyoka Cowden) TAB] 90 tablet 1    Sig: TAKE 1 TABLET(50 MCG) BY MOUTH DAILY BEFORE BREAKFAST     Endocrinology:  Hypothyroid Agents Failed - 07/11/2020  9:05 AM      Failed - TSH needs to be rechecked within 3 months after an abnormal result. Refill until TSH is due.      Passed - TSH in normal range and within 360 days    TSH  Date Value Ref Range Status  01/22/2020 4.160 0.450 - 4.500 uIU/mL Final         Passed - Valid encounter within last 12 months    Recent Outpatient Visits  1 month ago Acute non-recurrent maxillary sinusitis   [facility] Duanne Limerick, MD   5 months ago Annual physical exam   [facility] Reubin Milan, MD   1 year ago Bronchitis   [facility] Reubin Milan, MD   1 year ago Annual physical exam   [facility] Reubin Milan, MD   2 years ago Annual physical exam   [facility] Reubin Milan, MD      Future Appointments            In 3 weeks Judithann Graves Nyoka Cowden, MD [facility]   In 6 months Judithann Graves Nyoka Cowden, MD [facility], [facility]

## 2020-07-30 ENCOUNTER — Ambulatory Visit: Payer: Commercial Managed Care - PPO | Admitting: Internal Medicine

## 2020-08-03 ENCOUNTER — Ambulatory Visit: Payer: Commercial Managed Care - PPO | Admitting: Internal Medicine

## 2020-08-03 ENCOUNTER — Other Ambulatory Visit: Payer: Self-pay

## 2020-08-03 ENCOUNTER — Encounter: Payer: Self-pay | Admitting: Internal Medicine

## 2020-08-03 VITALS — BP 124/88 | HR 62 | Ht 63.0 in | Wt 140.0 lb

## 2020-08-03 DIAGNOSIS — Z23 Encounter for immunization: Secondary | ICD-10-CM

## 2020-08-03 DIAGNOSIS — K219 Gastro-esophageal reflux disease without esophagitis: Secondary | ICD-10-CM | POA: Diagnosis not present

## 2020-08-03 DIAGNOSIS — E034 Atrophy of thyroid (acquired): Secondary | ICD-10-CM

## 2020-08-03 DIAGNOSIS — M5442 Lumbago with sciatica, left side: Secondary | ICD-10-CM | POA: Diagnosis not present

## 2020-08-03 DIAGNOSIS — I10 Essential (primary) hypertension: Secondary | ICD-10-CM | POA: Diagnosis not present

## 2020-08-03 DIAGNOSIS — G8929 Other chronic pain: Secondary | ICD-10-CM

## 2020-08-03 MED ORDER — DICYCLOMINE HCL 10 MG PO CAPS: 10.0000 mg | ORAL_CAPSULE | Freq: Two times a day (BID) | ORAL | 5 refills | Status: DC

## 2020-08-03 NOTE — Progress Notes (Signed)
Date:  08/03/2020   Name:  Darlene Newman   DOB:  09-23-1961   MRN:  [id number]   Chief Complaint: Follow-up (6 month) and Flu Vaccine  Hypertension This is a chronic problem. Pertinent negatives include no chest pain, headaches, palpitations or shortness of breath. Past treatments include angiotensin blockers and beta blockers. The current treatment provides significant improvement. There is no history of kidney disease, CAD/MI or CVA.  Gastroesophageal Reflux She complains of heartburn. She reports no abdominal pain, no chest pain or no coughing. This is a recurrent problem. The problem occurs rarely. Pertinent negatives include no fatigue. Treatments tried: Bentyl - uses only twice a day. The treatment provided significant relief.  Back Pain This is a new problem. The current episode started more than 1 month ago. The problem has been rapidly improving since onset. The pain is present in the lumbar spine. The quality of the pain is described as burning. The pain radiates to the left foot. The pain is mild. Pertinent negatives include no abdominal pain, chest pain, dysuria, fever, headaches or numbness. She has tried muscle relaxant (and ESI) for the symptoms. The treatment provided significant relief.    Lab Results  Component Value Date   CREATININE 0.83 01/22/2020   BUN 18 01/22/2020   NA 142 01/22/2020   K 4.2 01/22/2020   CL 103 01/22/2020   CO2 26 01/22/2020   Lab Results  Component Value Date   CHOL 180 01/22/2020   HDL 39 (L) 01/22/2020   LDLCALC 111 (H) 01/22/2020   TRIG 173 (H) 01/22/2020   CHOLHDL 4.6 (H) 01/22/2020   Lab Results  Component Value Date   TSH 4.160 01/22/2020   No results found for: HGBA1C Lab Results  Component Value Date   WBC 6.1 01/22/2020   HGB 12.1 01/22/2020   HCT 36.1 01/22/2020   MCV 97 01/22/2020   PLT 185 01/22/2020   Lab Results  Component Value Date   ALT 31 01/22/2020   AST 28 01/22/2020   ALKPHOS 112 01/22/2020    BILITOT 0.4 01/22/2020     Review of Systems  Constitutional: Negative for appetite change, fatigue, fever and unexpected weight change.  Eyes: Negative for visual disturbance.  Respiratory: Negative for cough, chest tightness and shortness of breath.   Cardiovascular: Negative for chest pain, palpitations and leg swelling.  Gastrointestinal: Positive for heartburn. Negative for abdominal pain.  Genitourinary: Negative for dysuria and hematuria.  Musculoskeletal: Positive for back pain. Negative for arthralgias, joint swelling and myalgias.  Neurological: Negative for tremors, numbness and headaches.  Psychiatric/Behavioral: Negative for dysphoric mood.    Patient Active Problem List   Diagnosis Date Noted  . Atherosclerosis of aorta (HCC) 01/22/2020  . Localized swelling of right foot 01/21/2019  . Degenerative cervical disc 06/12/2017  . Hypothyroidism due to acquired atrophy of thyroid 11/24/2016  . Renal insufficiency 11/24/2016  . Impingement syndrome of right shoulder 05/20/2015  . Hypertriglyceridemia 02/17/2015  . Benign hypertension 02/17/2015  . Climacteric 02/17/2015  . History of fundoplication 02/17/2015  . Tobacco use disorder, mild, in sustained remission 02/17/2015  . Dermatitis contusiformis 05/31/2012  . Acid reflux 05/24/2012  . Lung mass 08/31/2011    Allergies  Allergen Reactions  . Ibuprofen     - kidney function low so don't take no more.     Past Surgical History:  Procedure Laterality Date  . COLONOSCOPY  2015  . COLONOSCOPY WITH ESOPHAGOGASTRODUODENOSCOPY (EGD)  2015  . GASTRIC FUNDOPLICATION    .  HIATAL HERNIA REPAIR  2015  . TOTAL VAGINAL HYSTERECTOMY  2012    Social History   Tobacco Use  . Smoking status: Former Smoker    Packs/day: 1.50    Years: 20.00    Pack years: 30.00    Types: Cigarettes    Quit date: 10/30/2006    Years since quitting: 13.7  . Smokeless tobacco: Never Used  Vaping Use  . Vaping Use: Never used    Substance Use Topics  . Alcohol use: No    Alcohol/week: 0.0 standard drinks  . Drug use: No     Medication list has been reviewed and updated.  Current Meds  Medication Sig  . dicyclomine (BENTYL) 10 MG capsule TAKE 1 CAPSULE(10 MG) BY MOUTH FOUR TIMES DAILY BEFORE MEALS AND AT BEDTIME  . levothyroxine (SYNTHROID) 50 MCG tablet TAKE 1 TABLET(50 MCG) BY MOUTH DAILY BEFORE BREAKFAST  . losartan (COZAAR) 50 MG tablet TAKE 1 TABLET(50 MG) BY MOUTH DAILY  . metoprolol tartrate (LOPRESSOR) 50 MG tablet TAKE 1 TABLET(50 MG) BY MOUTH TWICE DAILY  . omeprazole (PRILOSEC) 40 MG capsule TAKE 1 CAPSULE(40 MG) BY MOUTH DAILY  . PARoxetine (PAXIL) 20 MG tablet TAKE 1 TABLET BY MOUTH EVERY DAY  . simvastatin (ZOCOR) 40 MG tablet TAKE 1 TABLET(40 MG) BY MOUTH DAILY    PHQ 2/9 Scores 08/03/2020 06/01/2020 01/22/2020 06/30/2019  PHQ - 2 Score 0 0 0 0  PHQ- 9 Score 0 0 0 -    GAD 7 : Generalized Anxiety Score 08/03/2020  Nervous, Anxious, on Edge 0  Control/stop worrying 0  Worry too much - different things 0  Trouble relaxing 0  Restless 0  Easily annoyed or irritable 0  Afraid - awful might happen 0  Total GAD 7 Score 0    BP Readings from Last 3 Encounters:  08/03/20 124/88  06/01/20 112/70  01/22/20 120/72    Physical Exam Vitals and nursing note reviewed.  Constitutional:      General: She is not in acute distress.    Appearance: She is well-developed.  HENT:     Head: Normocephalic and atraumatic.  Cardiovascular:     Rate and Rhythm: Normal rate and regular rhythm.     Pulses: Normal pulses.     Heart sounds: No murmur heard.   Pulmonary:     Effort: Pulmonary effort is normal. No respiratory distress.     Breath sounds: No wheezing or rhonchi.  Musculoskeletal:        General: Normal range of motion.     Cervical back: Normal range of motion.     Lumbar back: Normal. No spasms or tenderness. Negative right straight leg raise test and negative left straight leg raise  test.     Right lower leg: No edema.     Left lower leg: No edema.  Skin:    General: Skin is warm and dry.     Capillary Refill: Capillary refill takes less than 2 seconds.     Findings: No rash.  Neurological:     General: No focal deficit present.     Mental Status: She is alert and oriented to person, place, and time.     Sensory: Sensation is intact.     Motor: Motor function is intact.     Deep Tendon Reflexes:     Reflex Scores:      Patellar reflexes are 2+ on the right side and 2+ on the left side. Psychiatric:  Mood and Affect: Mood normal.        Behavior: Behavior normal.     Wt Readings from Last 3 Encounters:  08/03/20 140 lb (63.5 kg)  06/01/20 137 lb (62.1 kg)  01/22/20 134 lb (60.8 kg)    BP 124/88   Pulse 62   Ht 5\' 3"  (1.6 m)   Wt 140 lb (63.5 kg)   SpO2 98%   BMI 24.80 kg/m   Assessment and Plan: 1. Benign hypertension Clinically stable exam with well controlled BP on current meds. Tolerating medications without side effects at this time. Pt to continue current regimen and low sodium diet; benefits of regular exercise as able discussed.  2. Hypothyroidism due to acquired atrophy of thyroid supplements  3. Chronic left-sided low back pain with left-sided sciatica Improved after ESI  4. Gastroesophageal reflux disease without esophagitis - dicyclomine (BENTYL) 10 MG capsule; Take 1 capsule (10 mg total) by mouth in the morning and at bedtime.  Dispense: 60 capsule; Refill: 5   Partially dictated using . Any errors are unintentional.  Animal nutritionist, MD [facility]  08/03/2020

## 2020-08-12 ENCOUNTER — Other Ambulatory Visit: Payer: Self-pay | Admitting: Internal Medicine

## 2020-08-12 DIAGNOSIS — I10 Essential (primary) hypertension: Secondary | ICD-10-CM

## 2020-09-16 ENCOUNTER — Institutional Professional Consult (permissible substitution): Payer: Commercial Managed Care - PPO | Admitting: Pulmonary Disease

## 2020-09-30 ENCOUNTER — Other Ambulatory Visit: Payer: Self-pay | Admitting: Internal Medicine

## 2020-09-30 DIAGNOSIS — K219 Gastro-esophageal reflux disease without esophagitis: Secondary | ICD-10-CM

## 2021-01-22 ENCOUNTER — Other Ambulatory Visit: Payer: Self-pay | Admitting: Internal Medicine

## 2021-01-22 DIAGNOSIS — E034 Atrophy of thyroid (acquired): Secondary | ICD-10-CM

## 2021-01-28 ENCOUNTER — Encounter: Payer: Commercial Managed Care - PPO | Admitting: Internal Medicine

## 2021-02-01 ENCOUNTER — Ambulatory Visit (INDEPENDENT_AMBULATORY_CARE_PROVIDER_SITE_OTHER): Payer: Commercial Managed Care - PPO | Admitting: Internal Medicine

## 2021-02-01 ENCOUNTER — Other Ambulatory Visit: Payer: Self-pay

## 2021-02-01 ENCOUNTER — Encounter: Payer: Self-pay | Admitting: Internal Medicine

## 2021-02-01 VITALS — BP 148/98 | HR 70 | Temp 98.1°F | Ht 63.0 in | Wt 138.0 lb

## 2021-02-01 DIAGNOSIS — Z1231 Encounter for screening mammogram for malignant neoplasm of breast: Secondary | ICD-10-CM

## 2021-02-01 DIAGNOSIS — F17201 Nicotine dependence, unspecified, in remission: Secondary | ICD-10-CM

## 2021-02-01 DIAGNOSIS — E034 Atrophy of thyroid (acquired): Secondary | ICD-10-CM

## 2021-02-01 DIAGNOSIS — Z0000 Encounter for general adult medical examination without abnormal findings: Secondary | ICD-10-CM | POA: Diagnosis not present

## 2021-02-01 DIAGNOSIS — R918 Other nonspecific abnormal finding of lung field: Secondary | ICD-10-CM

## 2021-02-01 DIAGNOSIS — H9313 Tinnitus, bilateral: Secondary | ICD-10-CM | POA: Diagnosis not present

## 2021-02-01 DIAGNOSIS — I10 Essential (primary) hypertension: Secondary | ICD-10-CM

## 2021-02-01 DIAGNOSIS — I700 Atherosclerosis of aorta: Secondary | ICD-10-CM | POA: Diagnosis not present

## 2021-02-01 LAB — POCT URINALYSIS DIPSTICK
Bilirubin, UA: NEGATIVE
Blood, UA: NEGATIVE
Glucose, UA: NEGATIVE
Ketones, UA: NEGATIVE
Leukocytes, UA: NEGATIVE
Nitrite, UA: NEGATIVE
Protein, UA: NEGATIVE
Spec Grav, UA: >=1.03 — AB (ref 1.010–1.025)
Urobilinogen, UA: 0.2 E.U./dL
pH, UA: 6 (ref 5.0–8.0)

## 2021-02-01 MED ORDER — PAROXETINE HCL 20 MG PO TABS: 20.0000 mg | ORAL_TABLET | Freq: Every day | ORAL | 1 refills | Status: DC

## 2021-02-01 MED ORDER — LEVOTHYROXINE SODIUM 50 MCG PO TABS: 50.0000 ug | ORAL_TABLET | Freq: Every day | ORAL | 1 refills | Status: DC

## 2021-02-01 MED ORDER — LOSARTAN POTASSIUM 100 MG PO TABS: 100.0000 mg | ORAL_TABLET | Freq: Every day | ORAL | 1 refills | Status: DC

## 2021-02-01 NOTE — Patient Instructions (Signed)
Schedule your mammogram;

## 2021-02-01 NOTE — Progress Notes (Signed)
Date:  02/01/2021   Name:  Darlene Newman   DOB:  11/22/1960   MRN:  [id number]   Chief Complaint: Annual Exam (Breast exam no pap)  Darlene Newman is a 60 y.o. female who presents today for her Complete Annual Exam. She feels fairly well. She reports exercising none. She reports she is sleeping well. Breast complaints none.  Mammogram: 01/2020 DEXA: none Pap smear: discontinued Colonoscopy: 10/2013  Immunization History  Administered Date(s) Administered  . Influenza,inj,Quad PF,6+ Mos 08/03/2020  . Influenza-Unspecified 07/05/2015, 08/13/2017  . PFIZER(Purple Top)SARS-COV-2 Vaccination 09/04/2020, 09/30/2020    Hypertension This is a chronic problem. The problem is unchanged. The problem is controlled. Associated symptoms include shortness of breath. Pertinent negatives include no chest pain, headaches or palpitations. Past treatments include angiotensin blockers and beta blockers. The current treatment provides significant improvement. There are no compliance problems.  There is no history of kidney disease, CAD/MI or CVA. Identifiable causes of hypertension include a thyroid problem. There is no history of chronic renal disease.  Thyroid Problem Presents for follow-up visit. Patient reports no anxiety, constipation, diarrhea, fatigue, palpitations or tremors. The symptoms have been stable. Her past medical history is significant for hyperlipidemia. There is no history of diabetes.  Hyperlipidemia This is a chronic problem. The problem is controlled. Exacerbating diseases include hypothyroidism. She has no history of chronic renal disease or diabetes. Associated symptoms include shortness of breath. Pertinent negatives include no chest pain. Current antihyperlipidemic treatment includes statins. The current treatment provides significant improvement of lipids. There are no compliance problems.  Risk factors for coronary artery disease include hypertension and dyslipidemia.   Gastroesophageal Reflux She complains of coughing and heartburn. She reports no abdominal pain, no chest pain, no choking, no dysphagia, no sore throat, no water brash or no wheezing. This is a chronic problem. The problem occurs occasionally. Pertinent negatives include no fatigue. She has tried a PPI for the symptoms. The treatment provided significant relief.  Shortness of Breath This is a recurrent problem. The problem occurs every several days. The problem has been unchanged. Pertinent negatives include no abdominal pain, chest pain, fever, headaches, leg swelling, rash, sore throat, vomiting or wheezing. The symptoms are aggravated by pollens. Risk factors: hx of smoking. She has tried beta agonist inhalers for the symptoms. The treatment provided no relief. Her past medical history is significant for COPD (possible mild COPD -). There is no history of CAD.  Tinnitus - she has ringing in both ears, constant without hearing loss.  She had a hearing test but no ENT evaluation.  She denies dizziness, HA, balance issues.  Lab Results  Component Value Date   CREATININE 0.83 01/22/2020   BUN 18 01/22/2020   NA 142 01/22/2020   K 4.2 01/22/2020   CL 103 01/22/2020   CO2 26 01/22/2020   Lab Results  Component Value Date   CHOL 180 01/22/2020   HDL 39 (L) 01/22/2020   LDLCALC 111 (H) 01/22/2020   TRIG 173 (H) 01/22/2020   CHOLHDL 4.6 (H) 01/22/2020   Lab Results  Component Value Date   TSH 4.160 01/22/2020   No results found for: HGBA1C Lab Results  Component Value Date   WBC 6.1 01/22/2020   HGB 12.1 01/22/2020   HCT 36.1 01/22/2020   MCV 97 01/22/2020   PLT 185 01/22/2020   Lab Results  Component Value Date   ALT 31 01/22/2020   AST 28 01/22/2020   ALKPHOS 112 01/22/2020  BILITOT 0.4 01/22/2020     Review of Systems  Constitutional: Negative for chills, fatigue and fever.  HENT: Positive for tinnitus. Negative for congestion, hearing loss, sore throat, trouble  swallowing and voice change.   Eyes: Negative for visual disturbance.  Respiratory: Positive for cough and shortness of breath. Negative for choking, chest tightness and wheezing.   Cardiovascular: Negative for chest pain, palpitations and leg swelling.  Gastrointestinal: Positive for heartburn. Negative for abdominal pain, constipation, diarrhea, dysphagia and vomiting.  Endocrine: Negative for polydipsia and polyuria.  Genitourinary: Negative for dysuria, frequency, genital sores, vaginal bleeding and vaginal discharge.  Musculoskeletal: Negative for arthralgias, gait problem and joint swelling.  Skin: Negative for color change and rash.  Neurological: Negative for dizziness, tremors, light-headedness and headaches.  Hematological: Negative for adenopathy. Does not bruise/bleed easily.  Psychiatric/Behavioral: Negative for dysphoric mood and sleep disturbance. The patient is not nervous/anxious.     Patient Active Problem List   Diagnosis Date Noted  . Chronic left-sided low back pain with left-sided sciatica 08/03/2020  . Atherosclerosis of aorta (HCC) 01/22/2020  . Degenerative cervical disc 06/12/2017  . Hypothyroidism due to acquired atrophy of thyroid 11/24/2016  . Impingement syndrome of right shoulder 05/20/2015  . Hypertriglyceridemia 02/17/2015  . Benign hypertension 02/17/2015  . Climacteric 02/17/2015  . History of fundoplication 02/17/2015  . Tobacco use disorder, mild, in sustained remission 02/17/2015  . Dermatitis contusiformis 05/31/2012  . GERD without esophagitis 05/24/2012  . Multiple lung nodules on CT 08/31/2011    Allergies  Allergen Reactions  . Ibuprofen     - kidney function low so don't take no more.     Past Surgical History:  Procedure Laterality Date  . COLONOSCOPY  2015  . COLONOSCOPY WITH ESOPHAGOGASTRODUODENOSCOPY (EGD)  2015  . GASTRIC FUNDOPLICATION    . HIATAL HERNIA REPAIR  2015  . TOTAL VAGINAL HYSTERECTOMY  2012    Social History    Tobacco Use  . Smoking status: Former Smoker    Packs/day: 1.50    Years: 20.00    Pack years: 30.00    Types: Cigarettes    Quit date: 10/30/2006    Years since quitting: 14.2  . Smokeless tobacco: Never Used  Vaping Use  . Vaping Use: Never used  Substance Use Topics  . Alcohol use: No    Alcohol/week: 0.0 standard drinks  . Drug use: No     Medication list has been reviewed and updated.  Current Meds  Medication Sig  . dicyclomine (BENTYL) 10 MG capsule Take 1 capsule (10 mg total) by mouth in the morning and at bedtime.  . metoprolol tartrate (LOPRESSOR) 50 MG tablet TAKE 1 TABLET(50 MG) BY MOUTH TWICE DAILY  . omeprazole (PRILOSEC) 40 MG capsule TAKE 1 CAPSULE(40 MG) BY MOUTH DAILY  . simvastatin (ZOCOR) 40 MG tablet TAKE 1 TABLET(40 MG) BY MOUTH DAILY  . [DISCONTINUED] levothyroxine (SYNTHROID) 50 MCG tablet TAKE 1 TABLET(50 MCG) BY MOUTH DAILY BEFORE BREAKFAST  . [DISCONTINUED] losartan (COZAAR) 50 MG tablet TAKE 1 TABLET(50 MG) BY MOUTH DAILY  . [DISCONTINUED] PARoxetine (PAXIL) 20 MG tablet TAKE 1 TABLET BY MOUTH EVERY DAY  . losartan (COZAAR) 100 MG tablet Take 1 tablet (100 mg total) by mouth daily.    PHQ 2/9 Scores 02/01/2021 08/03/2020 06/01/2020 01/22/2020  PHQ - 2 Score 0 0 0 0  PHQ- 9 Score 0 0 0 0    GAD 7 : Generalized Anxiety Score 02/01/2021 08/03/2020  Nervous, Anxious, on Edge 0 0  Control/stop worrying 0 0  Worry too much - different things 0 0  Trouble relaxing 0 0  Restless 0 0  Easily annoyed or irritable 0 0  Afraid - awful might happen 0 0  Total GAD 7 Score 0 0    BP Readings from Last 3 Encounters:  02/01/21 (!) 148/98  08/03/20 124/88  06/01/20 112/70    Physical Exam Vitals and nursing note reviewed.  Constitutional:      General: She is not in acute distress.    Appearance: She is well-developed.  HENT:     Head: Normocephalic and atraumatic.     Right Ear: Hearing, tympanic membrane and ear canal normal.     Left Ear: Hearing,  tympanic membrane and ear canal normal.     Nose:     Right Sinus: No maxillary sinus tenderness.     Left Sinus: No maxillary sinus tenderness.  Eyes:     General: No scleral icterus.       Right eye: No discharge.        Left eye: No discharge.     Conjunctiva/sclera: Conjunctivae normal.  Neck:     Thyroid: No thyromegaly.     Vascular: No carotid bruit.  Cardiovascular:     Rate and Rhythm: Normal rate and regular rhythm.     Pulses: Normal pulses.     Heart sounds: Normal heart sounds.  Pulmonary:     Effort: Pulmonary effort is normal. No respiratory distress.     Breath sounds: No wheezing.  Chest:  Breasts:     Right: No mass, nipple discharge, skin change or tenderness.     Left: No mass, nipple discharge, skin change or tenderness.    Abdominal:     General: Bowel sounds are normal.     Palpations: Abdomen is soft.     Tenderness: There is no abdominal tenderness.  Musculoskeletal:     Cervical back: Normal range of motion. No erythema.     Right lower leg: No edema.     Left lower leg: No edema.  Lymphadenopathy:     Cervical: No cervical adenopathy.  Skin:    General: Skin is warm and dry.     Findings: No rash.  Neurological:     Mental Status: She is alert and oriented to person, place, and time.     Cranial Nerves: No cranial nerve deficit.     Sensory: No sensory deficit.     Deep Tendon Reflexes: Reflexes are normal and symmetric.  Psychiatric:        Attention and Perception: Attention normal.        Mood and Affect: Mood normal.     Wt Readings from Last 3 Encounters:  02/01/21 138 lb (62.6 kg)  08/03/20 140 lb (63.5 kg)  06/01/20 137 lb (62.1 kg)    BP (!) 148/98   Pulse 70   Temp 98.1 F (36.7 C) (Oral)   Ht 5\' 3"  (1.6 m)   Wt 138 lb (62.6 kg)   SpO2 98%   BMI 24.45 kg/m   Assessment and Plan: 1. Annual physical exam Normal exam Continue healthy diet; recommend regular exercise  2. Encounter for screening mammogram for breast  cancer Schedule at [facility] - MM 3D SCREEN BREAST BILATERAL; Future  3. Benign hypertension BP is not well controlled - has been slightly elevated over the past few visits Continue low salt diet and will increase dose of losartan Recheck in 6 weeks - CBC with  Differential/Platelet - Comprehensive metabolic panel - POCT urinalysis dipstick - losartan (COZAAR) 100 MG tablet; Take 1 tablet (100 mg total) by mouth daily.  Dispense: 90 tablet; Refill: 1  4. Hypothyroidism due to acquired atrophy of thyroid supplemented - TSH + free T4 - levothyroxine (SYNTHROID) 50 MCG tablet; Take 1 tablet (50 mcg total) by mouth daily before breakfast.  Dispense: 90 tablet; Refill: 1  5. Atherosclerosis of aorta (HCC) Seen on CT; check labs and advise on statin dose change if indicated - Lipid panel  6. Tobacco use disorder, mild, in sustained remission Remains tobacco free  7. Multiple lung nodules on CT Last CT in 2021 - all nodules were stable  No further follow needed  8. Tinnitus of both ears Recommend ENT evaluation - Ambulatory referral to ENT   Partially dictated using Dragon software. Any errors are unintentional.  Bari Edward, MD [facility]  02/01/2021

## 2021-02-02 LAB — COMPREHENSIVE METABOLIC PANEL
ALT: 26 IU/L (ref 0–32)
AST: 23 IU/L (ref 0–40)
Albumin/Globulin Ratio: 1.8 (ref 1.2–2.2)
Albumin: 4.6 g/dL (ref 3.8–4.9)
Alkaline Phosphatase: 130 IU/L — ABNORMAL HIGH (ref 44–121)
BUN/Creatinine Ratio: 18 (ref 9–23)
BUN: 18 mg/dL (ref 6–24)
Bilirubin Total: 0.5 mg/dL (ref 0.0–1.2)
CO2: 24 mmol/L (ref 20–29)
Calcium: 9.6 mg/dL (ref 8.7–10.2)
Chloride: 99 mmol/L (ref 96–106)
Creatinine, Ser: 1.01 mg/dL — ABNORMAL HIGH (ref 0.57–1.00)
Globulin, Total: 2.5 g/dL (ref 1.5–4.5)
Glucose: 110 mg/dL — ABNORMAL HIGH (ref 65–99)
Potassium: 3.2 mmol/L — ABNORMAL LOW (ref 3.5–5.2)
Sodium: 142 mmol/L (ref 134–144)
Total Protein: 7.1 g/dL (ref 6.0–8.5)
eGFR: 64 mL/min/{1.73_m2} (ref >59)

## 2021-02-02 LAB — CBC WITH DIFFERENTIAL/PLATELET
Basophils Absolute: 0 10*3/uL (ref 0.0–0.2)
Basos: 1 %
EOS (ABSOLUTE): 0.2 10*3/uL (ref 0.0–0.4)
Eos: 2 %
Hematocrit: 38.1 % (ref 34.0–46.6)
Hemoglobin: 13 g/dL (ref 11.1–15.9)
Immature Grans (Abs): 0 10*3/uL (ref 0.0–0.1)
Immature Granulocytes: 0 %
Lymphocytes Absolute: 1.3 10*3/uL (ref 0.7–3.1)
Lymphs: 19 %
MCH: 32.2 pg (ref 26.6–33.0)
MCHC: 34.1 g/dL (ref 31.5–35.7)
MCV: 94 fL (ref 79–97)
Monocytes Absolute: 0.6 10*3/uL (ref 0.1–0.9)
Monocytes: 8 %
Neutrophils Absolute: 4.8 10*3/uL (ref 1.4–7.0)
Neutrophils: 70 %
Platelets: 253 10*3/uL (ref 150–450)
RBC: 4.04 x10E6/uL (ref 3.77–5.28)
RDW: 13.1 % (ref 11.7–15.4)
WBC: 6.9 10*3/uL (ref 3.4–10.8)

## 2021-02-02 LAB — LIPID PANEL
Chol/HDL Ratio: 5.8 ratio — ABNORMAL HIGH (ref 0.0–4.4)
Cholesterol, Total: 210 mg/dL — ABNORMAL HIGH (ref 100–199)
HDL: 36 mg/dL — ABNORMAL LOW (ref >39)
LDL Chol Calc (NIH): 121 mg/dL — ABNORMAL HIGH (ref 0–99)
Triglycerides: 300 mg/dL — ABNORMAL HIGH (ref 0–149)
VLDL Cholesterol Cal: 53 mg/dL — ABNORMAL HIGH (ref 5–40)

## 2021-02-02 LAB — TSH+FREE T4
Free T4: 1.14 ng/dL (ref 0.82–1.77)
TSH: 25.6 u[IU]/mL — ABNORMAL HIGH (ref 0.450–4.500)

## 2021-02-03 ENCOUNTER — Other Ambulatory Visit: Payer: Self-pay | Admitting: Internal Medicine

## 2021-02-03 DIAGNOSIS — E781 Pure hyperglyceridemia: Secondary | ICD-10-CM

## 2021-02-03 MED ORDER — ROSUVASTATIN CALCIUM 5 MG PO TABS: 5.0000 mg | ORAL_TABLET | Freq: Every day | ORAL | 1 refills | Status: DC

## 2021-02-25 ENCOUNTER — Other Ambulatory Visit: Payer: Self-pay | Admitting: Internal Medicine

## 2021-02-25 ENCOUNTER — Telehealth: Payer: Self-pay | Admitting: *Deleted

## 2021-02-25 DIAGNOSIS — I10 Essential (primary) hypertension: Secondary | ICD-10-CM

## 2021-02-25 DIAGNOSIS — Z87891 Personal history of nicotine dependence: Secondary | ICD-10-CM

## 2021-02-25 DIAGNOSIS — Z122 Encounter for screening for malignant neoplasm of respiratory organs: Secondary | ICD-10-CM

## 2021-02-25 NOTE — Telephone Encounter (Signed)
Dosage change on 02/01/21. Losartan Potassium 50 mg tabs has been discontinued-refill refused.

## 2021-02-25 NOTE — Telephone Encounter (Signed)
Received referral for  lung cancer screening scan from Mignon Pine, NP in 02/2020, after patient was seen in the lung nodule program. Contacted patient and obtained smoking history,(former smoker, quit in 2008, 1.5 ppd x 20 yrs) as well as answering questions related to screening process. Patient denies signs of lung cancer such as weight loss or hemoptysis. Patient denies comorbidity that would prevent curative treatment if lung cancer were found. Patient is scheduled for shared decision making visit on 03/04/21 and CT scan on 03/10/21 @ 8:45 am.

## 2021-03-03 ENCOUNTER — Other Ambulatory Visit: Payer: Self-pay

## 2021-03-03 ENCOUNTER — Encounter: Payer: Self-pay | Admitting: Internal Medicine

## 2021-03-03 ENCOUNTER — Ambulatory Visit: Payer: Commercial Managed Care - PPO | Admitting: Internal Medicine

## 2021-03-03 VITALS — BP 144/98 | HR 57 | Ht 63.0 in | Wt 143.0 lb

## 2021-03-03 DIAGNOSIS — I10 Essential (primary) hypertension: Secondary | ICD-10-CM | POA: Diagnosis not present

## 2021-03-03 DIAGNOSIS — I700 Atherosclerosis of aorta: Secondary | ICD-10-CM | POA: Diagnosis not present

## 2021-03-03 DIAGNOSIS — R918 Other nonspecific abnormal finding of lung field: Secondary | ICD-10-CM | POA: Diagnosis not present

## 2021-03-03 MED ORDER — AMLODIPINE BESYLATE 5 MG PO TABS: 5.0000 mg | ORAL_TABLET | Freq: Every day | ORAL | 1 refills | Status: DC

## 2021-03-03 NOTE — Progress Notes (Signed)
Date:  03/03/2021   Name:  Darlene Newman   DOB:  Dec 18, 1960   MRN:  [id number]   Chief Complaint: Hypertension (Follow up with medication.)  Hypertension This is a chronic problem. The problem is uncontrolled. Pertinent negatives include no chest pain, headaches, palpitations or shortness of breath. Past treatments include beta blockers and angiotensin blockers (losartan dose increased last month). Improvement on treatment: she has not checked her BP. There are no compliance problems (has occasional cough but not worse).   Hyperlipidemia This is a chronic problem. The problem is uncontrolled. Pertinent negatives include no chest pain or shortness of breath. Current antihyperlipidemic treatment includes statins (changed to Crestor last month).    Lab Results  Component Value Date   CREATININE 1.01 (H) 02/01/2021   BUN 18 02/01/2021   NA 142 02/01/2021   K 3.2 (L) 02/01/2021   CL 99 02/01/2021   CO2 24 02/01/2021   Lab Results  Component Value Date   CHOL 210 (H) 02/01/2021   HDL 36 (L) 02/01/2021   LDLCALC 121 (H) 02/01/2021   TRIG 300 (H) 02/01/2021   CHOLHDL 5.8 (H) 02/01/2021   Lab Results  Component Value Date   TSH 25.600 (H) 02/01/2021   No results found for: HGBA1C Lab Results  Component Value Date   WBC 6.9 02/01/2021   HGB 13.0 02/01/2021   HCT 38.1 02/01/2021   MCV 94 02/01/2021   PLT 253 02/01/2021   Lab Results  Component Value Date   ALT 26 02/01/2021   AST 23 02/01/2021   ALKPHOS 130 (H) 02/01/2021   BILITOT 0.5 02/01/2021     Review of Systems  Constitutional: Negative for fatigue and unexpected weight change.  HENT: Negative for nosebleeds.   Eyes: Negative for visual disturbance.  Respiratory: Negative for cough, chest tightness, shortness of breath and wheezing.   Cardiovascular: Negative for chest pain, palpitations and leg swelling.  Gastrointestinal: Negative for abdominal pain, constipation and diarrhea.  Neurological: Negative for  dizziness, weakness, light-headedness and headaches.    Patient Active Problem List   Diagnosis Date Noted  . Chronic left-sided low back pain with left-sided sciatica 08/03/2020  . Atherosclerosis of aorta (HCC) 01/22/2020  . Degenerative cervical disc 06/12/2017  . Hypothyroidism due to acquired atrophy of thyroid 11/24/2016  . Impingement syndrome of right shoulder 05/20/2015  . Hypertriglyceridemia 02/17/2015  . Benign hypertension 02/17/2015  . Climacteric 02/17/2015  . History of fundoplication 02/17/2015  . Tobacco use disorder, mild, in sustained remission 02/17/2015  . Dermatitis contusiformis 05/31/2012  . GERD without esophagitis 05/24/2012  . Multiple lung nodules on CT 08/31/2011    Allergies  Allergen Reactions  . Ibuprofen     - kidney function low so don't take no more.     Past Surgical History:  Procedure Laterality Date  . COLONOSCOPY  2015  . COLONOSCOPY WITH ESOPHAGOGASTRODUODENOSCOPY (EGD)  2015  . GASTRIC FUNDOPLICATION    . HIATAL HERNIA REPAIR  2015  . TOTAL VAGINAL HYSTERECTOMY  2012    Social History   Tobacco Use  . Smoking status: Former Smoker    Packs/day: 1.50    Years: 20.00    Pack years: 30.00    Types: Cigarettes    Quit date: 10/30/2006    Years since quitting: 14.3  . Smokeless tobacco: Never Used  Vaping Use  . Vaping Use: Never used  Substance Use Topics  . Alcohol use: No    Alcohol/week: 0.0 standard drinks  .  Drug use: No     Medication list has been reviewed and updated.  Current Meds  Medication Sig  . dicyclomine (BENTYL) 10 MG capsule Take 1 capsule (10 mg total) by mouth in the morning and at bedtime.  Marland Kitchen levothyroxine (SYNTHROID) 50 MCG tablet Take 1 tablet (50 mcg total) by mouth daily before breakfast.  . losartan (COZAAR) 100 MG tablet Take 1 tablet (100 mg total) by mouth daily.  . metoprolol tartrate (LOPRESSOR) 50 MG tablet TAKE 1 TABLET(50 MG) BY MOUTH TWICE DAILY  . omeprazole (PRILOSEC) 40 MG capsule  TAKE 1 CAPSULE(40 MG) BY MOUTH DAILY  . PARoxetine (PAXIL) 20 MG tablet Take 1 tablet (20 mg total) by mouth daily.  . rosuvastatin (CRESTOR) 5 MG tablet Take 1 tablet (5 mg total) by mouth daily.    PHQ 2/9 Scores 03/03/2021 02/01/2021 08/03/2020 06/01/2020  PHQ - 2 Score 1 0 0 0  PHQ- 9 Score 3 0 0 0    GAD 7 : Generalized Anxiety Score 03/03/2021 02/01/2021 08/03/2020  Nervous, Anxious, on Edge 0 0 0  Control/stop worrying 0 0 0  Worry too much - different things 0 0 0  Trouble relaxing 0 0 0  Restless 0 0 0  Easily annoyed or irritable 0 0 0  Afraid - awful might happen 0 0 0  Total GAD 7 Score 0 0 0  Anxiety Difficulty Not difficult at all - -    BP Readings from Last 3 Encounters:  03/03/21 (!) 144/98  02/01/21 (!) 148/98  08/03/20 124/88    Physical Exam Vitals and nursing note reviewed.  Constitutional:      General: She is not in acute distress.    Appearance: She is well-developed.  HENT:     Head: Normocephalic and atraumatic.  Cardiovascular:     Rate and Rhythm: Normal rate and regular rhythm.     Pulses: Normal pulses.  Pulmonary:     Effort: Pulmonary effort is normal. No respiratory distress.     Breath sounds: No wheezing or rhonchi.  Musculoskeletal:     Cervical back: Normal range of motion.     Right lower leg: No edema.     Left lower leg: No edema.  Skin:    General: Skin is warm and dry.     Findings: No rash.  Neurological:     Mental Status: She is alert and oriented to person, place, and time.  Psychiatric:        Mood and Affect: Mood normal.        Behavior: Behavior normal.     Wt Readings from Last 3 Encounters:  03/03/21 143 lb (64.9 kg)  02/01/21 138 lb (62.6 kg)  08/03/20 140 lb (63.5 kg)    BP (!) 144/98   Pulse (!) 57   Ht 5\' 3"  (1.6 m)   Wt 143 lb (64.9 kg)   SpO2 100%   BMI 25.33 kg/m   Assessment and Plan: 1. Benign hypertension Clinically stable exam but BP not controlled. Tolerating medications without side effects  at this time. Add amlodipine and recheck in 2 months Regular exercise recommended; limit sodium intake -amLODipine (NORVASC) 5 MG tablet; Take 1 tablet (5 mg total) by mouth daily.  Dispense: 90 tablet; Refill: 1  2. Multiple lung nodules on CT Has been stable for several years so no further nodule follow up is needed However, she is 14 years from quitting smoking so should go ahead with the LDCT exam scheduled next  week  3. Atherosclerosis of aorta (HCC) Now on Crestor without side effects Check labs next visit  Partially dictated using Dragon software. Any errors are unintentional.  Bari Edward, MD [facility]  03/03/2021

## 2021-03-04 ENCOUNTER — Inpatient Hospital Stay: Payer: Commercial Managed Care - PPO | Admitting: Nurse Practitioner

## 2021-03-08 ENCOUNTER — Other Ambulatory Visit: Payer: Self-pay

## 2021-03-08 ENCOUNTER — Inpatient Hospital Stay: Payer: Commercial Managed Care - PPO | Attending: Oncology | Admitting: Hospice and Palliative Medicine

## 2021-03-08 DIAGNOSIS — Z87891 Personal history of nicotine dependence: Secondary | ICD-10-CM | POA: Diagnosis not present

## 2021-03-08 DIAGNOSIS — Z122 Encounter for screening for malignant neoplasm of respiratory organs: Secondary | ICD-10-CM

## 2021-03-08 NOTE — Progress Notes (Signed)
Virtual Visit via Telephone Note  I connected with@ on 03/08/21 at@ by a telephone and verified that I am speaking with the correct person using two identifiers.   I discussed the limitations of evaluation and management by telemedicine and the availability of in person appointments. The patient expressed understanding and agreed to proceed.  Location: Patient: [facility] Provider: Home  In accordance with CMS guidelines, patient has met eligibility criteria including age, absence of signs or symptoms of lung cancer.  Social History   Tobacco Use  . Smoking status: Former Smoker    Packs/day: 1.50    Years: 20.00    Pack years: 30.00    Types: Cigarettes    Quit date: 10/30/2006    Years since quitting: 14.3  . Smokeless tobacco: Never Used  Vaping Use  . Vaping Use: Never used  Substance Use Topics  . Alcohol use: No    Alcohol/week: 0.0 standard drinks  . Drug use: No      A shared decision-making session was conducted prior to the performance of CT scan. This includes one or more decision aids, includes benefits and harms of screening, follow-up diagnostic testing, over-diagnosis, false positive rate, and total radiation exposure.   Counseling on the importance of adherence to annual lung cancer LDCT screening, impact of co-morbidities, and ability or willingness to undergo diagnosis and treatment is imperative for compliance of the program.   Counseling on the importance of continued smoking cessation for former smokers; the importance of smoking cessation for current smokers, and information about tobacco cessation interventions have been given to patient including [place] and [facility] quit [place] programs.   Written order for lung cancer screening with LDCT has been given to the patient and any and all questions have been answered to the best of my abilities.    Yearly follow up will be coordinated by Burgess Estelle, Thoracic Navigator.  Time Total: 10 minutes  Visit  consisted of counseling and education dealing with complex health screening. Greater than 50%  of this time was spent counseling and coordinating care related to the above assessment and plan.  Signed by: Altha Harm, PhD, NP-C

## 2021-03-10 ENCOUNTER — Telehealth: Payer: Self-pay | Admitting: Internal Medicine

## 2021-03-10 ENCOUNTER — Ambulatory Visit: Admission: RE | Admit: 2021-03-10 | Payer: Commercial Managed Care - PPO | Source: Ambulatory Visit

## 2021-03-10 NOTE — Telephone Encounter (Signed)
Patient informed. Will call next week if no improvement.

## 2021-03-10 NOTE — Telephone Encounter (Signed)
Patient was prescribed amLODipine (NORVASC) 5 MG tablet on 03/03/2021 and was advised to follow up with PCP if experiencing any side effects, patient states both legs are swollen, please advise

## 2021-03-10 NOTE — Telephone Encounter (Signed)
Please advise. Amlodipine causing both patients legs to swell.

## 2021-03-10 NOTE — Telephone Encounter (Signed)
Cut the amlodipine in half - take only half per day.  Call next week if no improvement.

## 2021-03-18 ENCOUNTER — Telehealth: Payer: Self-pay | Admitting: Internal Medicine

## 2021-03-18 NOTE — Telephone Encounter (Signed)
Stop amlodipine but continue the other medications.  Follow up in June after return from vacation.

## 2021-03-18 NOTE — Telephone Encounter (Signed)
Called and spoke with patient. Informed of stopping amlodipine. Will make appt after her vacation to follow up for edema and BP.

## 2021-03-18 NOTE — Telephone Encounter (Signed)
Pt said she was told to cut amlodipine in half and she did and still having ankle swelling. Pt is going out of town today and will return back on 03-26-2021. Please advise

## 2021-03-19 ENCOUNTER — Other Ambulatory Visit: Payer: Self-pay | Admitting: Internal Medicine

## 2021-03-19 DIAGNOSIS — I10 Essential (primary) hypertension: Secondary | ICD-10-CM

## 2021-03-19 DIAGNOSIS — K219 Gastro-esophageal reflux disease without esophagitis: Secondary | ICD-10-CM

## 2021-03-19 NOTE — Telephone Encounter (Signed)
Requested Prescriptions  Pending Prescriptions Disp Refills  . metoprolol tartrate (LOPRESSOR) 50 MG tablet [Pharmacy Med Name: METOPROLOL TARTRATE 50MG  TABLETS] 180 tablet 1    Sig: TAKE 1 TABLET(50 MG) BY MOUTH TWICE DAILY     Cardiovascular:  Beta Blockers Failed - 03/19/2021  3:12 AM      Failed - Last BP in normal range    BP Readings from Last 1 Encounters:  03/03/21 (!) 144/98         Passed - Last Heart Rate in normal range    Pulse Readings from Last 1 Encounters:  03/03/21 (!) 57         Passed - Valid encounter within last 6 months    Recent Outpatient Visits          2 weeks ago Benign hypertension   [facility] 05/03/21, MD   1 month ago Annual physical exam   [facility], MD   7 months ago Benign hypertension   [facility], MD   9 months ago Acute non-recurrent maxillary sinusitis   [facility] Reubin Milan, MD   1 year ago Annual physical exam   [facility], MD      Future Appointments            In 1 month Reubin Milan Judithann Graves, MD [facility]   In 10 months [facility], MD [facility]           . omeprazole (PRILOSEC) 40 MG capsule [Pharmacy Med Name: OMEPRAZOLE 40MG  CAPSULES] 90 capsule 2    Sig: TAKE 1 CAPSULE(40 MG) BY MOUTH DAILY     Gastroenterology: Proton Pump Inhibitors Passed - 03/19/2021  3:12 AM      Passed - Valid encounter within last 12 months    Recent Outpatient Visits          2 weeks ago Benign hypertension   [facility] , MD   1 month ago Annual physical exam   [facility] Reubin Milan, MD   7 months ago Benign hypertension   [facility] Reubin Milan, MD   9 months ago Acute non-recurrent maxillary sinusitis   [facility], MD   1 year ago Annual physical exam   [facility] Duanne Limerick, MD      Future Appointments            In 1 month [facility] Reubin Milan, MD [facility]   In 10 months Nyoka Cowden [facility], MD [facility]

## 2021-04-18 IMAGING — CT CT CHEST WITHOUT CONTRAST
2 of 4 series · 15 of 36 positions shown, 18 images · non-contrast
Comparison: None.

CLINICAL DATA: Lung nodules.  Smoker.

EXAM:
CT CHEST WITHOUT CONTRAST
TECHNIQUE: Multidetector CT imaging of the chest was performed following the
standard protocol without IV contrast.

[Series 2: chest · axial · 0.62mm/px · z∈[-1143,-893]mm · 12 of 149 slices shown, 15 images (1 of 2)]
[im 12/149  mediastinal]
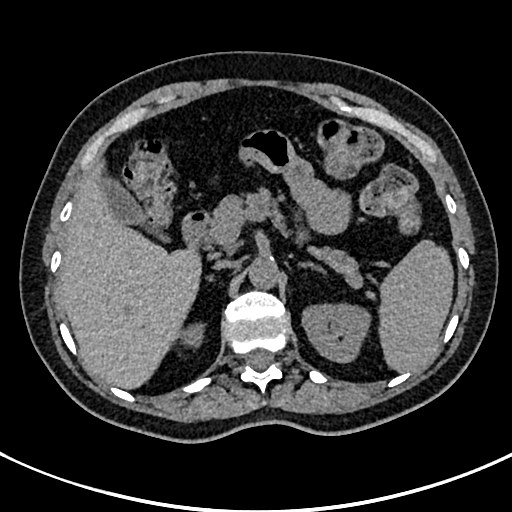
[im 12/149  lung]
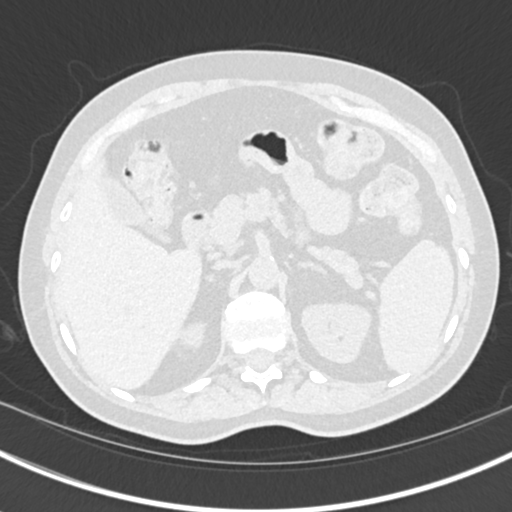
[im 23/149  lung]
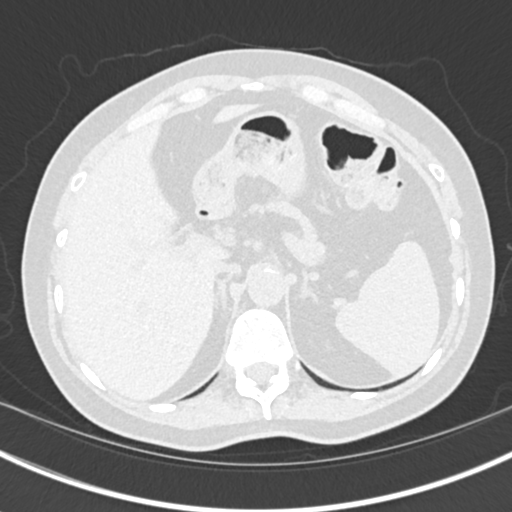
[im 35/149  lung]
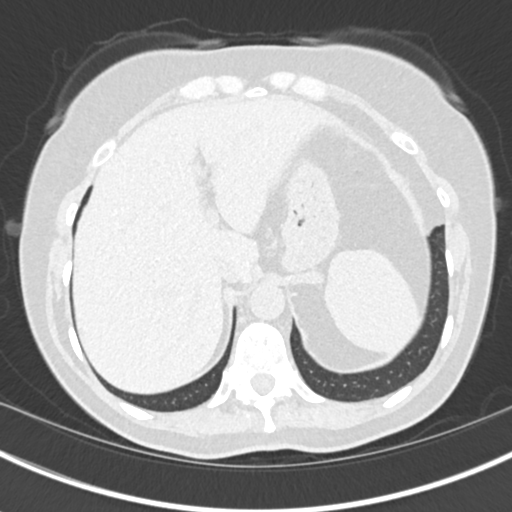
[im 46/149  lung]
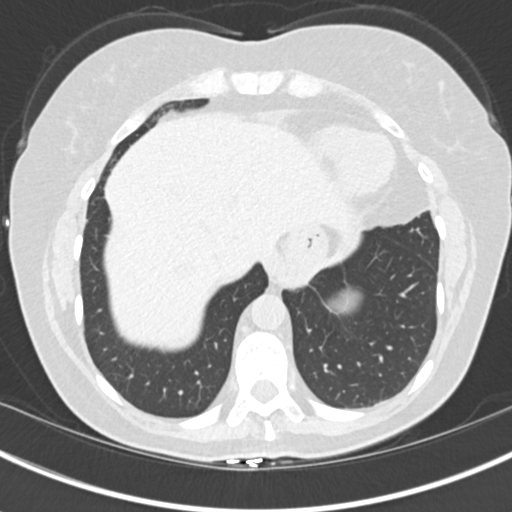
[im 57/149  mediastinal]
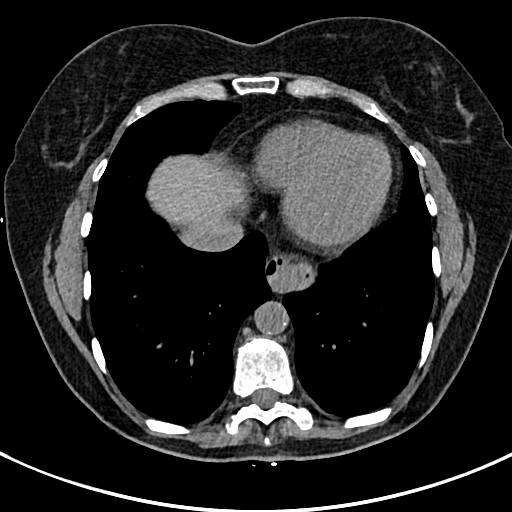
[im 57/149  lung]
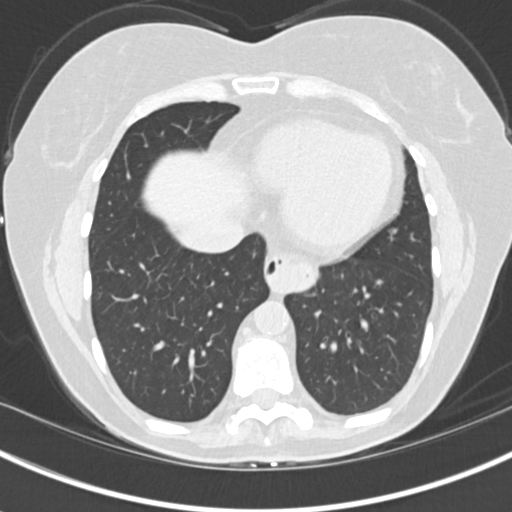
[im 69/149  lung]
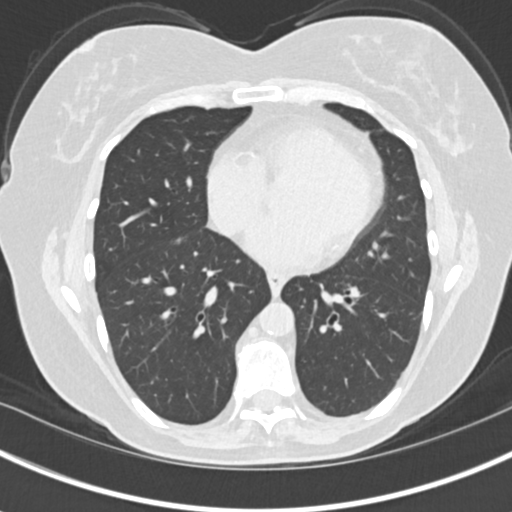
[im 80/149  lung]
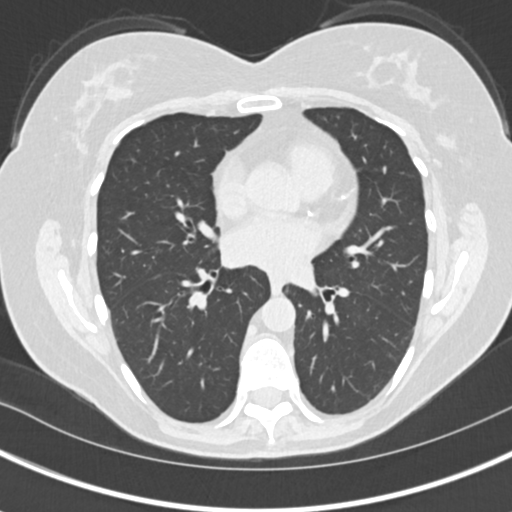
[im 92/149  lung]
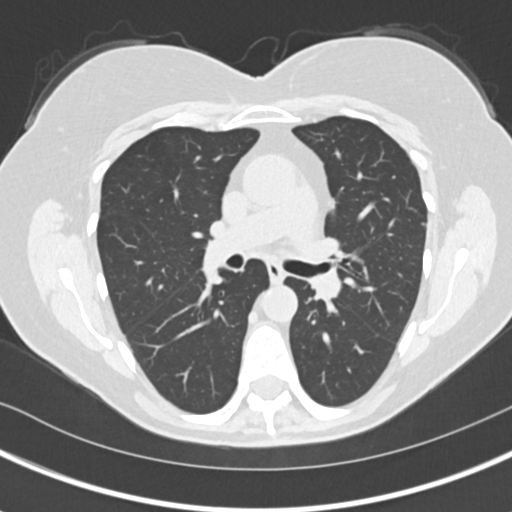
[im 103/149  mediastinal]
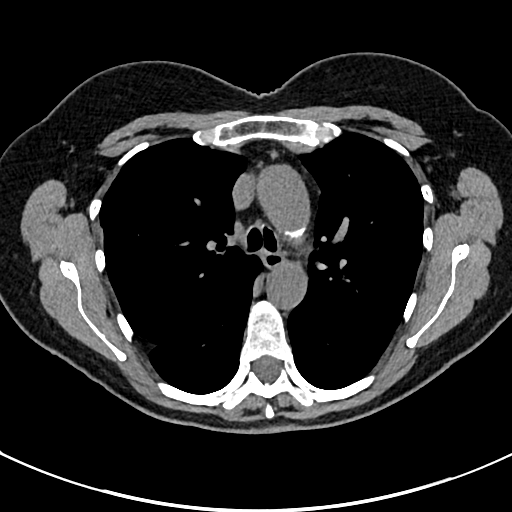
[im 103/149  lung]
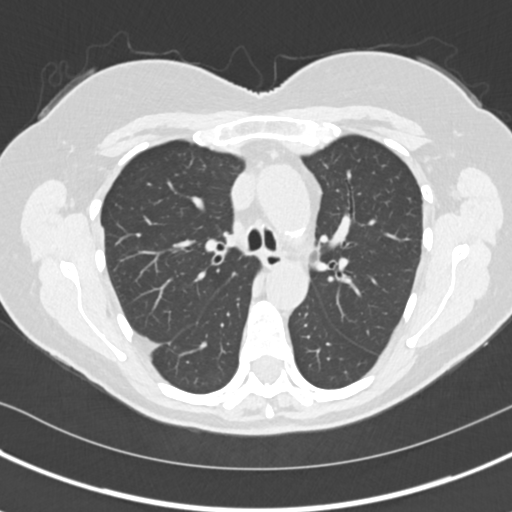
[im 114/149  lung]
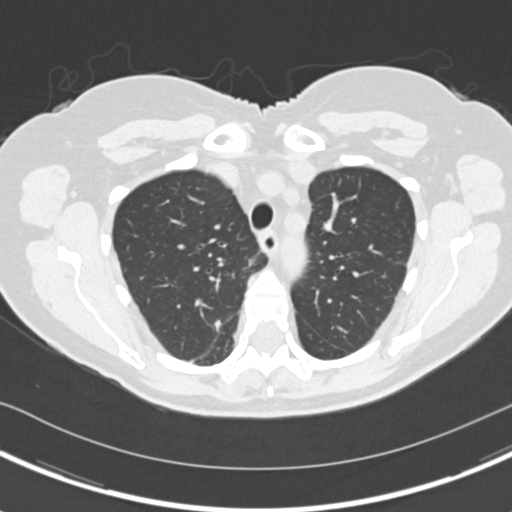
[im 126/149  lung]
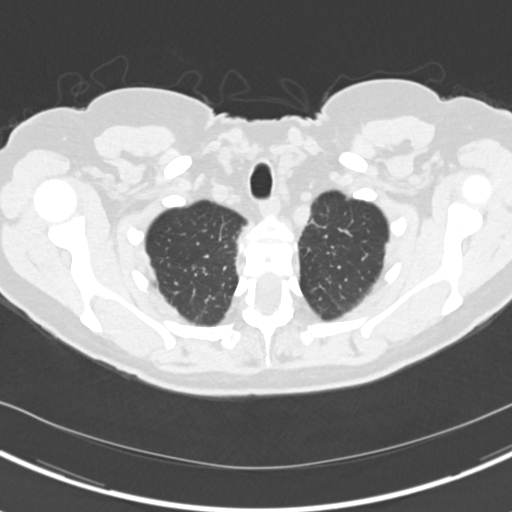
[im 137/149  lung]
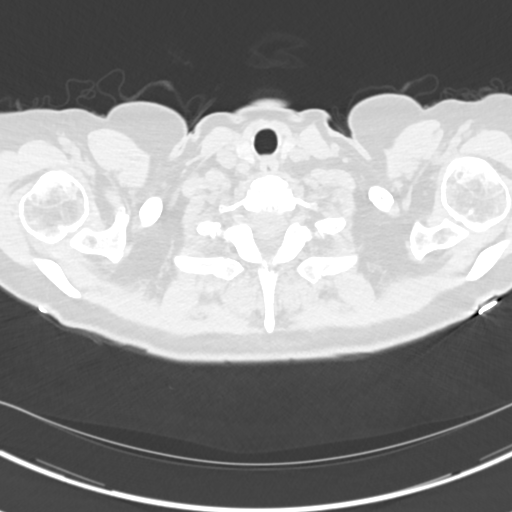

[Series 5: chest · coronal · 0.59mm/px · 3 of 141 slices shown (2 of 2)]
[im 29/141  lung]
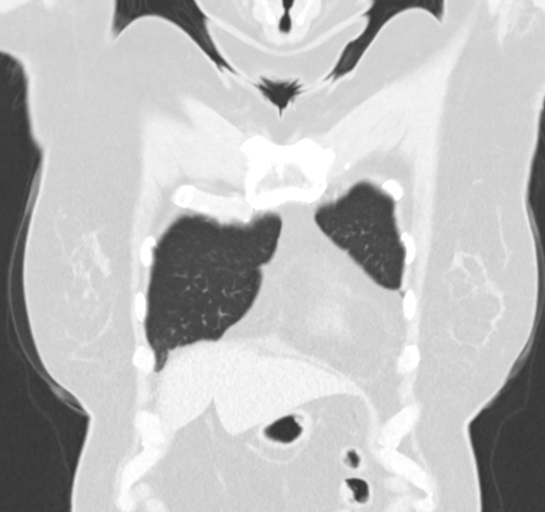
[im 57/141  lung]
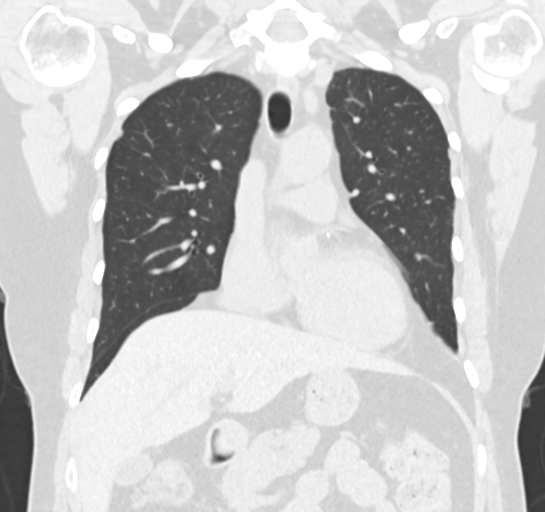
[im 85/141  lung]
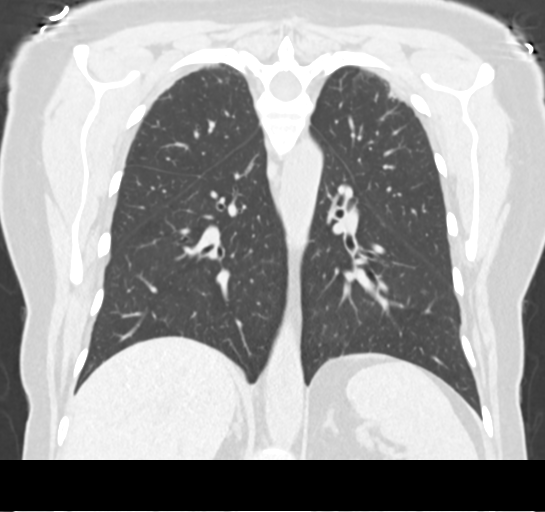

[15 of 36 positions shown; findings below may reference images not displayed]

FINDINGS: Cardiovascular: Atherosclerotic calcification of the aorta and
coronary arteries. Heart size normal. No pericardial effusion.

Mediastinum/Nodes: No pathologically enlarged mediastinal or
axillary lymph nodes. Hilar regions are difficult to evaluate
without IV contrast but appear grossly unremarkable. Esophagus is
grossly unremarkable.

Lungs/Pleura: A few scattered pulmonary nodules measure up to 5 mm
bilaterally. Subpleural scarring is seen in the upper lobes. No
pleural fluid. Airway is unremarkable.

Upper Abdomen: Visualized portions of the liver, gallbladder,
adrenal glands, kidneys, spleen, pancreas, stomach and bowel are
unremarkable with exception of a moderate hiatal hernia. No upper
abdominal adenopathy.

Musculoskeletal: No worrisome lytic or sclerotic lesions.
IMPRESSION: 1. Pulmonary nodules measure 5 mm or less in size. In the absence of
available prior examinations for comparison, non-contrast chest CT
can be considered in 12 months as patient is a current smoker. This
recommendation follows the consensus statement: Guidelines for
Management of Incidental Pulmonary Nodules Detected on CT Images:
2. Moderate hiatal hernia.
3. Aortic atherosclerosis (8OBR1-170.0). Coronary artery
calcification.

## 2021-05-03 ENCOUNTER — Ambulatory Visit: Admission: RE | Admit: 2021-05-03 | Payer: Commercial Managed Care - PPO | Source: Ambulatory Visit

## 2021-05-04 ENCOUNTER — Other Ambulatory Visit: Payer: Self-pay

## 2021-05-04 ENCOUNTER — Ambulatory Visit: Payer: Commercial Managed Care - PPO | Admitting: Internal Medicine

## 2021-05-04 ENCOUNTER — Encounter: Payer: Self-pay | Admitting: Internal Medicine

## 2021-05-04 VITALS — BP 134/82 | HR 61 | Temp 98.4°F | Resp 14 | Ht 63.0 in | Wt 137.0 lb

## 2021-05-04 DIAGNOSIS — I10 Essential (primary) hypertension: Secondary | ICD-10-CM

## 2021-05-04 DIAGNOSIS — I700 Atherosclerosis of aorta: Secondary | ICD-10-CM

## 2021-05-04 DIAGNOSIS — E034 Atrophy of thyroid (acquired): Secondary | ICD-10-CM

## 2021-05-04 NOTE — Progress Notes (Signed)
Date:  05/04/2021   Name:  Darlene Newman   DOB:  1961-01-13   MRN:  [id number]   Chief Complaint: Hypertension  Hypertension This is a chronic problem. The problem is controlled. Pertinent negatives include no chest pain, headaches, palpitations, peripheral edema or shortness of breath. Past treatments include beta blockers and angiotensin blockers. The current treatment provides significant improvement. Identifiable causes of hypertension include a thyroid problem.  Hyperlipidemia This is a chronic problem. The problem is uncontrolled. Recent lipid tests were reviewed and are high. Pertinent negatives include no chest pain or shortness of breath. Current antihyperlipidemic treatment includes statins (changed to stronger statin last visit). There are no compliance problems.   Thyroid Problem Presents for follow-up (TSH was elevated but T4 was normal) visit. Patient reports no anxiety, fatigue or palpitations. The symptoms have been stable. Her past medical history is significant for hyperlipidemia.   Lab Results  Component Value Date   CREATININE 1.01 (H) 02/01/2021   BUN 18 02/01/2021   NA 142 02/01/2021   K 3.2 (L) 02/01/2021   CL 99 02/01/2021   CO2 24 02/01/2021   Lab Results  Component Value Date   CHOL 210 (H) 02/01/2021   HDL 36 (L) 02/01/2021   LDLCALC 121 (H) 02/01/2021   TRIG 300 (H) 02/01/2021   CHOLHDL 5.8 (H) 02/01/2021   Lab Results  Component Value Date   TSH 25.600 (H) 02/01/2021   No results found for: HGBA1C Lab Results  Component Value Date   WBC 6.9 02/01/2021   HGB 13.0 02/01/2021   HCT 38.1 02/01/2021   MCV 94 02/01/2021   PLT 253 02/01/2021   Lab Results  Component Value Date   ALT 26 02/01/2021   AST 23 02/01/2021   ALKPHOS 130 (H) 02/01/2021   BILITOT 0.5 02/01/2021     Review of Systems  Constitutional:  Negative for chills, fatigue and unexpected weight change.  Respiratory:  Negative for chest tightness, shortness of breath and  wheezing.   Cardiovascular:  Negative for chest pain, palpitations and leg swelling.  Neurological:  Negative for dizziness and headaches.  Psychiatric/Behavioral:  Negative for dysphoric mood and sleep disturbance. The patient is not nervous/anxious.    Patient Active Problem List   Diagnosis Date Noted   Chronic left-sided low back pain with left-sided sciatica 08/03/2020   Atherosclerosis of aorta (HCC) 01/22/2020   Degenerative cervical disc 06/12/2017   Hypothyroidism due to acquired atrophy of thyroid 11/24/2016   Impingement syndrome of right shoulder 05/20/2015   Hypertriglyceridemia 02/17/2015   Benign hypertension 02/17/2015   Climacteric 02/17/2015   History of fundoplication 02/17/2015   Tobacco use disorder, mild, in sustained remission 02/17/2015   Dermatitis contusiformis 05/31/2012   GERD without esophagitis 05/24/2012   Multiple lung nodules on CT 08/31/2011    Allergies  Allergen Reactions   Ibuprofen     Reduced renal fxn    Past Surgical History:  Procedure Laterality Date   COLONOSCOPY  2015   COLONOSCOPY WITH ESOPHAGOGASTRODUODENOSCOPY (EGD)  2015   GASTRIC FUNDOPLICATION     HIATAL HERNIA REPAIR  2015   TOTAL VAGINAL HYSTERECTOMY  2012    Social History   Tobacco Use   Smoking status: Former    Packs/day: 1.50    Years: 20.00    Pack years: 30.00    Types: Cigarettes    Quit date: 10/30/2006    Years since quitting: 14.5   Smokeless tobacco: Never  Vaping Use   Vaping  Use: Never used  Substance Use Topics   Alcohol use: No    Alcohol/week: 0.0 standard drinks   Drug use: No     Medication list has been reviewed and updated.  Current Meds  Medication Sig   dicyclomine (BENTYL) 10 MG capsule Take 1 capsule (10 mg total) by mouth in the morning and at bedtime.   levothyroxine (SYNTHROID) 50 MCG tablet Take 1 tablet (50 mcg total) by mouth daily before breakfast.   losartan (COZAAR) 100 MG tablet Take 1 tablet (100 mg total) by mouth  daily.   metoprolol tartrate (LOPRESSOR) 50 MG tablet TAKE 1 TABLET(50 MG) BY MOUTH TWICE DAILY   omeprazole (PRILOSEC) 40 MG capsule TAKE 1 CAPSULE(40 MG) BY MOUTH DAILY   PARoxetine (PAXIL) 20 MG tablet Take 1 tablet (20 mg total) by mouth daily.   rosuvastatin (CRESTOR) 5 MG tablet Take 1 tablet (5 mg total) by mouth daily.   [DISCONTINUED] predniSONE (DELTASONE) 5 MG tablet Take as directed - 6 day taper; take as needed pain flareup.    PHQ 2/9 Scores 05/04/2021 03/03/2021 02/01/2021 08/03/2020  PHQ - 2 Score 0 1 0 0  PHQ- 9 Score 0 3 0 0    GAD 7 : Generalized Anxiety Score 05/04/2021 03/03/2021 02/01/2021 08/03/2020  Nervous, Anxious, on Edge 0 0 0 0  Control/stop worrying 0 0 0 0  Worry too much - different things 0 0 0 0  Trouble relaxing 0 0 0 0  Restless 0 0 0 0  Easily annoyed or irritable 0 0 0 0  Afraid - awful might happen 0 0 0 0  Total GAD 7 Score 0 0 0 0  Anxiety Difficulty Not difficult at all Not difficult at all - -    BP Readings from Last 3 Encounters:  05/04/21 134/82  03/03/21 (!) 144/98  02/01/21 (!) 148/98    Physical Exam Vitals and nursing note reviewed.  Constitutional:      General: She is not in acute distress.    Appearance: Normal appearance. She is well-developed.  HENT:     Head: Normocephalic and atraumatic.  Cardiovascular:     Rate and Rhythm: Normal rate and regular rhythm.     Pulses: Normal pulses.     Heart sounds: No murmur heard. Pulmonary:     Effort: Pulmonary effort is normal. No respiratory distress.     Breath sounds: No wheezing or rhonchi.  Musculoskeletal:     Cervical back: Normal range of motion.     Right lower leg: No edema.     Left lower leg: No edema.  Lymphadenopathy:     Cervical: No cervical adenopathy.  Skin:    General: Skin is warm and dry.     Capillary Refill: Capillary refill takes less than 2 seconds.     Findings: No rash.  Neurological:     General: No focal deficit present.     Mental Status: She is  alert and oriented to person, place, and time.  Psychiatric:        Mood and Affect: Mood normal.        Behavior: Behavior normal.    Wt Readings from Last 3 Encounters:  05/04/21 137 lb (62.1 kg)  03/03/21 143 lb (64.9 kg)  02/01/21 138 lb (62.6 kg)    BP 134/82 (BP Location: Right Arm, Patient Position: Sitting, Cuff Size: Normal)   Pulse 61   Temp 98.4 F (36.9 C) (Oral)   Resp 14   Ht  5\' 3"  (1.6 m)   Wt 137 lb (62.1 kg)   SpO2 98%   BMI 24.27 kg/m   Assessment and Plan: 1. Benign hypertension Clinically stable exam with well controlled BP. Tolerating medications without side effects at this time.  Did not tolerate amlodipine due to edema. BP now is improved. Pt to continue current regimen and low sodium diet; benefits of regular exercise as able discussed. - Comprehensive metabolic panel  2. Atherosclerosis of aorta (HCC) Now on Crestor without side effects - Lipid panel - Comprehensive metabolic panel  3. Hypothyroidism due to acquired atrophy of thyroid Taking supplement more regularly. - TSH+T4F+T3Free   Partially dictated using . Any errors are unintentional.  Animal nutritionist, MD [facility]  05/04/2021

## 2021-05-05 LAB — COMPREHENSIVE METABOLIC PANEL
ALT: 21 IU/L (ref 0–32)
AST: 22 IU/L (ref 0–40)
Albumin/Globulin Ratio: 2.1 (ref 1.2–2.2)
Albumin: 4.6 g/dL (ref 3.8–4.9)
Alkaline Phosphatase: 130 IU/L — ABNORMAL HIGH (ref 44–121)
BUN/Creatinine Ratio: 13 (ref 9–23)
BUN: 13 mg/dL (ref 6–24)
Bilirubin Total: 0.5 mg/dL (ref 0.0–1.2)
CO2: 25 mmol/L (ref 20–29)
Calcium: 9.4 mg/dL (ref 8.7–10.2)
Chloride: 102 mmol/L (ref 96–106)
Creatinine, Ser: 1.01 mg/dL — ABNORMAL HIGH (ref 0.57–1.00)
Globulin, Total: 2.2 g/dL (ref 1.5–4.5)
Glucose: 108 mg/dL — ABNORMAL HIGH (ref 65–99)
Potassium: 3.5 mmol/L (ref 3.5–5.2)
Sodium: 142 mmol/L (ref 134–144)
Total Protein: 6.8 g/dL (ref 6.0–8.5)
eGFR: 64 mL/min/{1.73_m2} (ref >59)

## 2021-05-05 LAB — TSH+T4F+T3FREE
Free T4: 1.27 ng/dL (ref 0.82–1.77)
T3, Free: 2.6 pg/mL (ref 2.0–4.4)
TSH: 6.61 u[IU]/mL — ABNORMAL HIGH (ref 0.450–4.500)

## 2021-05-05 LAB — LIPID PANEL
Chol/HDL Ratio: 4.9 ratio — ABNORMAL HIGH (ref 0.0–4.4)
Cholesterol, Total: 183 mg/dL (ref 100–199)
HDL: 37 mg/dL — ABNORMAL LOW (ref >39)
LDL Chol Calc (NIH): 107 mg/dL — ABNORMAL HIGH (ref 0–99)
Triglycerides: 223 mg/dL — ABNORMAL HIGH (ref 0–149)
VLDL Cholesterol Cal: 39 mg/dL (ref 5–40)

## 2021-07-31 ENCOUNTER — Other Ambulatory Visit: Payer: Self-pay | Admitting: Internal Medicine

## 2021-07-31 DIAGNOSIS — I10 Essential (primary) hypertension: Secondary | ICD-10-CM

## 2021-07-31 DIAGNOSIS — E034 Atrophy of thyroid (acquired): Secondary | ICD-10-CM

## 2021-07-31 NOTE — Telephone Encounter (Signed)
Requested medication (s) are due for refill today: yes  Requested medication (s) are on the active medication list: yes  Last refill:  02/01/21 #90 1 RF  Future visit scheduled: yes  Notes to clinic:  abnormal TSH- per protocol needs to be rechecked within 3 months after abnormal result   Requested Prescriptions  Pending Prescriptions Disp Refills   levothyroxine (SYNTHROID) 50 MCG tablet [Pharmacy Med Name: LEVOTHYROXINE 0.05MG  ( ) TAB] 90 tablet     Sig: TAKE 1 TABLET BY MOUTH DAILY BEFORE BREAKFAST     Endocrinology:  Hypothyroid Agents Failed - 07/31/2021  3:12 AM      Failed - TSH needs to be rechecked within 3 months after an abnormal result. Refill until TSH is due.      Failed - TSH in normal range and within 360 days    TSH  Date Value Ref Range Status  05/04/2021 6.610 (H) 0.450 - 4.500 uIU/mL Final          Passed - Valid encounter within last 12 months    Recent Outpatient Visits           2 months ago Benign hypertension   [facility] Reubin Milan, MD   5 months ago Benign hypertension   [facility] Reubin Milan, MD   6 months ago Annual physical exam   [facility] Reubin Milan, MD   12 months ago Benign hypertension   [facility] Reubin Milan, MD   1 year ago Acute non-recurrent maxillary sinusitis   [facility] Duanne Limerick, MD       Future Appointments             In 6 months Reubin Milan, MD [facility]            Signed Prescriptions Disp Refills   losartan (COZAAR) 100 MG tablet 90 tablet 1    Sig: TAKE 1 TABLET(100 MG) BY MOUTH DAILY     Cardiovascular:  Angiotensin Receptor Blockers Failed - 07/31/2021  3:12 AM      Failed - Cr in normal range and within 180 days    Creatinine, Ser  Date Value Ref Range Status  05/04/2021 1.01 (H) 0.57 - 1.00 mg/dL Final          Passed - K in normal range and within 180 days    Potassium  Date Value  Ref Range Status  05/04/2021 3.5 3.5 - 5.2 mmol/L Final          Passed - Patient is not pregnant      Passed - Last BP in normal range    BP Readings from Last 1 Encounters:  05/04/21 134/82          Passed - Valid encounter within last 6 months    Recent Outpatient Visits           2 months ago Benign hypertension   [facility] Reubin Milan, MD   5 months ago Benign hypertension   [facility] Reubin Milan, MD   6 months ago Annual physical exam   [facility] Reubin Milan, MD   12 months ago Benign hypertension   [facility] Reubin Milan, MD   1 year ago Acute non-recurrent maxillary sinusitis   [facility] Duanne Limerick, MD       Future Appointments  In 6 months Judithann Graves Nyoka Cowden, MD [facility]             PARoxetine (PAXIL) 20 MG tablet 90 tablet 1    Sig: TAKE 1 TABLET(20 MG) BY MOUTH DAILY     Psychiatry:  Antidepressants - SSRI Passed - 07/31/2021  3:12 AM      Passed - Valid encounter within last 6 months    Recent Outpatient Visits           2 months ago Benign hypertension   [facility] Reubin Milan, MD   5 months ago Benign hypertension   [facility] Reubin Milan, MD   6 months ago Annual physical exam   [facility] Reubin Milan, MD   12 months ago Benign hypertension   [facility] Reubin Milan, MD   1 year ago Acute non-recurrent maxillary sinusitis   [facility] Duanne Limerick, MD       Future Appointments             In 6 months Judithann Graves Nyoka Cowden, MD [facility], [facility]

## 2021-08-06 ENCOUNTER — Other Ambulatory Visit: Payer: Self-pay | Admitting: Internal Medicine

## 2021-08-06 DIAGNOSIS — E781 Pure hyperglyceridemia: Secondary | ICD-10-CM

## 2021-08-24 ENCOUNTER — Other Ambulatory Visit: Payer: Self-pay | Admitting: Internal Medicine

## 2021-08-24 DIAGNOSIS — K219 Gastro-esophageal reflux disease without esophagitis: Secondary | ICD-10-CM

## 2021-08-24 NOTE — Telephone Encounter (Signed)
Requested Prescriptions  Pending Prescriptions Disp Refills  . dicyclomine (BENTYL) 10 MG capsule [Pharmacy Med Name: DICYCLOMINE 10MG  CAPSULES] 180 capsule 1    Sig: TAKE 1 CAPSULE(10 MG) BY MOUTH IN THE MORNING AND AT BEDTIME     Gastroenterology:  Antispasmodic Agents Passed - 08/24/2021  3:13 AM      Passed - Last Heart Rate in normal range    Pulse Readings from Last 1 Encounters:  05/04/21 61         Passed - Valid encounter within last 12 months    Recent Outpatient Visits          3 months ago Benign hypertension   [facility] 07/05/21, MD   5 months ago Benign hypertension   [facility], MD   6 months ago Annual physical exam   [facility], MD   1 year ago Benign hypertension   [facility] Reubin Milan, MD   1 year ago Acute non-recurrent maxillary sinusitis   [facility] Reubin Milan, MD      Future Appointments            In 5 months Duanne Limerick Judithann Graves, MD [facility], [facility]

## 2021-08-29 ENCOUNTER — Other Ambulatory Visit: Payer: Self-pay | Admitting: Internal Medicine

## 2021-08-29 DIAGNOSIS — I10 Essential (primary) hypertension: Secondary | ICD-10-CM

## 2021-08-29 NOTE — Telephone Encounter (Signed)
Requested Prescriptions  Pending Prescriptions Disp Refills  . metoprolol tartrate (LOPRESSOR) 50 MG tablet [Pharmacy Med Name: METOPROLOL TARTRATE 50MG  TABLETS] 180 tablet 1    Sig: TAKE 1 TABLET(50 MG) BY MOUTH TWICE DAILY     Cardiovascular:  Beta Blockers Passed - 08/29/2021  8:09 AM      Passed - Last BP in normal range    BP Readings from Last 1 Encounters:  05/04/21 134/82         Passed - Last Heart Rate in normal range    Pulse Readings from Last 1 Encounters:  05/04/21 61         Passed - Valid encounter within last 6 months    Recent Outpatient Visits          3 months ago Benign hypertension   [facility] 07/05/21, MD   5 months ago Benign hypertension   [facility], MD   6 months ago Annual physical exam   [facility], MD   1 year ago Benign hypertension   [facility] Reubin Milan, MD   1 year ago Acute non-recurrent maxillary sinusitis   [facility] Reubin Milan, MD      Future Appointments            In 5 months Duanne Limerick Judithann Graves, MD [facility], [facility]

## 2021-10-04 ENCOUNTER — Other Ambulatory Visit: Payer: Self-pay | Admitting: Internal Medicine

## 2021-10-04 DIAGNOSIS — E034 Atrophy of thyroid (acquired): Secondary | ICD-10-CM

## 2021-10-04 NOTE — Telephone Encounter (Signed)
Requested medication (s) are due for refill today: no  Requested medication (s) are on the active medication list: yes  Last refill:  08/01/21 #90  Future visit scheduled: yes  Notes to clinic:  overdue recheck labs   Requested Prescriptions  Pending Prescriptions Disp Refills   levothyroxine (SYNTHROID) 50 MCG tablet [Pharmacy Med Name: LEVOTHYROXINE 0.05MG  ( ) TAB] 90 tablet 0    Sig: TAKE 1 TABLET BY MOUTH DAILY BEFORE BREAKFAST     Endocrinology:  Hypothyroid Agents Failed - 10/04/2021  8:09 AM      Failed - TSH needs to be rechecked within 3 months after an abnormal result. Refill until TSH is due.      Failed - TSH in normal range and within 360 days    TSH  Date Value Ref Range Status  05/04/2021 6.610 (H) 0.450 - 4.500 uIU/mL Final          Passed - Valid encounter within last 12 months    Recent Outpatient Visits           5 months ago Benign hypertension   [facility] Reubin Milan, MD   7 months ago Benign hypertension   [facility] Reubin Milan, MD   8 months ago Annual physical exam   [facility] Re[facility] Milan, MD   1 year ago Benign hypertension   [facility] Reubin Milan, MD   1 year ago Acute non-recurrent maxillary sinusitis   [facility] Duanne Limerick, MD       Future Appointments             In 4 months Judithann Graves Nyoka Cowden, MD [facility], [facility]

## 2021-12-05 ENCOUNTER — Other Ambulatory Visit: Payer: Self-pay | Admitting: Internal Medicine

## 2021-12-05 DIAGNOSIS — K219 Gastro-esophageal reflux disease without esophagitis: Secondary | ICD-10-CM

## 2021-12-05 NOTE — Telephone Encounter (Signed)
Requested Prescriptions  Pending Prescriptions Disp Refills   omeprazole (PRILOSEC) 40 MG capsule [Pharmacy Med Name: OMEPRAZOLE 40MG  CAPSULES] 90 capsule 2    Sig: TAKE 1 CAPSULE(40 MG) BY MOUTH DAILY     Gastroenterology: Proton Pump Inhibitors Passed - 12/05/2021  8:09 AM      Passed - Valid encounter within last 12 months    Recent Outpatient Visits          7 months ago Benign hypertension   [place], [place]   9 months ago Benign hypertension   [facility] Glean Hess, MD   10 months ago Annual physical exam   [facility] Glean Hess, MD   1 year ago Benign hypertension   [facility] Glean Hess, MD   1 year ago Acute non-recurrent maxillary sinusitis   [facility] Juline Patch, MD      Future Appointments            In 2 months Army Melia Jesse Sans, MD [facility], [facility]

## 2022-01-13 ENCOUNTER — Other Ambulatory Visit: Payer: Self-pay | Admitting: Internal Medicine

## 2022-01-13 DIAGNOSIS — E034 Atrophy of thyroid (acquired): Secondary | ICD-10-CM

## 2022-01-13 NOTE — Telephone Encounter (Signed)
Called patient and patient got refill 01/05/22 . Requested by interface surescripts.  ? ?Requested Prescriptions  ?Refused Prescriptions Disp Refills  ?? levothyroxine (SYNTHROID) 50 MCG tablet [Pharmacy Med Name: LEVOTHYROXINE 0.05MG  (50MCG) TAB] 90 tablet 0  ?  Sig: TAKE 1 TABLET BY MOUTH DAILY BEFORE BREAKFAST  ?  ? Endocrinology:  Hypothyroid Agents Failed - 01/13/2022  8:09 AM  ?  ?  Failed - TSH in normal range and within 360 days  ?  TSH  ?Date Value Ref Range Status  ?05/04/2021 6.610 (H) 0.450 - 4.500 uIU/mL Final  ?   ?  ?  Passed - Valid encounter within last 12 months  ?  Recent Outpatient Visits   ?      ? 8 months ago Benign hypertension  ? [facility] Glean Hess, MD  ? 10 months ago Benign hypertension  ? [facility] Glean Hess, MD  ? 11 months ago Annual physical exam  ? [facility] Glean Hess, MD  ? 1 year ago Benign hypertension  ? [facility] Glean Hess, MD  ? 1 year ago Acute non-recurrent maxillary sinusitis  ? [facility] Juline Patch, MD  ?  ?  ?Future Appointments   ?        ? In 3 weeks Glean Hess, MD [facility], [place]  ?  ? ?  ?  ?  ? ?

## 2022-01-22 ENCOUNTER — Other Ambulatory Visit: Payer: Self-pay | Admitting: Internal Medicine

## 2022-01-22 DIAGNOSIS — I10 Essential (primary) hypertension: Secondary | ICD-10-CM

## 2022-01-24 NOTE — Telephone Encounter (Signed)
Courtesy refills, #90 0 refills, pt has appt 02/03/22. ? ?Requested Prescriptions  ?Pending Prescriptions Disp Refills  ?? losartan (COZAAR) 100 MG tablet [Pharmacy Med Name: LOSARTAN 100MG  TABLETS] 90 tablet 1  ?  Sig: TAKE 1 TABLET(100 MG) BY MOUTH DAILY  ?  ? Cardiovascular:  Angiotensin Receptor Blockers Failed - 01/22/2022  3:12 AM  ?  ?  Failed - Cr in normal range and within 180 days  ?  Creatinine, Ser  ?Date Value Ref Range Status  ?05/04/2021 1.01 (H) 0.57 - 1.00 mg/dL Final  ?   ?  ?  Failed - K in normal range and within 180 days  ?  Potassium  ?Date Value Ref Range Status  ?05/04/2021 3.5 3.5 - 5.2 mmol/L Final  ?   ?  ?  Failed - Valid encounter within last 6 months  ?  Recent Outpatient Visits   ?      ? 8 months ago Benign hypertension  ? [facility] Glean Hess, MD  ? 10 months ago Benign hypertension  ? [facility] Glean Hess, MD  ? 11 months ago Annual physical exam  ? [facility] Glean Hess, MD  ? 1 year ago Benign hypertension  ? [facility] Glean Hess, MD  ? 1 year ago Acute non-recurrent maxillary sinusitis  ? [facility] Juline Patch, MD  ?  ?  ?Future Appointments   ?        ? In 1 week Glean Hess, MD [facility], [place]  ?  ? ?  ?  ?  Passed - Patient is not pregnant  ?  ?  Passed - Last BP in normal range  ?  BP Readings from Last 1 Encounters:  ?05/04/21 134/82  ?   ?  ?  ?? PARoxetine (PAXIL) 20 MG tablet [Pharmacy Med Name: PAROXETINE 20MG  TABLETS] 90 tablet 1  ?  Sig: TAKE 1 TABLET(20 MG) BY MOUTH DAILY  ?  ? Psychiatry:  Antidepressants - SSRI Failed - 01/22/2022  3:12 AM  ?  ?  Failed - Valid encounter within last 6 months  ?  Recent Outpatient Visits   ?      ? 8 months ago Benign hypertension  ? [facility] Glean Hess, MD  ? 10 months ago Benign hypertension  ? [facility] Glean Hess, MD  ? 11 months ago Annual physical exam  ? [facility] Glean Hess, MD  ? 1 year ago Benign hypertension  ? [facility] Glean Hess, MD  ? 1 year ago Acute non-recurrent maxillary sinusitis  ? [facility] Juline Patch, MD  ?  ?  ?Future Appointments   ?        ? In 1 week Glean Hess, MD [facility], [place]  ?  ? ?  ?  ?  ? ? ?

## 2022-02-01 ENCOUNTER — Encounter: Payer: Self-pay | Admitting: Internal Medicine

## 2022-02-01 DIAGNOSIS — Z90710 Acquired absence of both cervix and uterus: Secondary | ICD-10-CM | POA: Insufficient documentation

## 2022-02-03 ENCOUNTER — Encounter: Payer: Self-pay | Admitting: Internal Medicine

## 2022-02-03 ENCOUNTER — Ambulatory Visit (INDEPENDENT_AMBULATORY_CARE_PROVIDER_SITE_OTHER): Payer: Commercial Managed Care - PPO | Admitting: Internal Medicine

## 2022-02-03 VITALS — BP 155/90 | HR 55 | Ht 63.0 in | Wt 140.0 lb

## 2022-02-03 DIAGNOSIS — Z0000 Encounter for general adult medical examination without abnormal findings: Secondary | ICD-10-CM | POA: Diagnosis not present

## 2022-02-03 DIAGNOSIS — I700 Atherosclerosis of aorta: Secondary | ICD-10-CM | POA: Diagnosis not present

## 2022-02-03 DIAGNOSIS — E034 Atrophy of thyroid (acquired): Secondary | ICD-10-CM | POA: Diagnosis not present

## 2022-02-03 DIAGNOSIS — I10 Essential (primary) hypertension: Secondary | ICD-10-CM | POA: Diagnosis not present

## 2022-02-03 DIAGNOSIS — K219 Gastro-esophageal reflux disease without esophagitis: Secondary | ICD-10-CM

## 2022-02-03 DIAGNOSIS — Z23 Encounter for immunization: Secondary | ICD-10-CM | POA: Diagnosis not present

## 2022-02-03 DIAGNOSIS — E782 Mixed hyperlipidemia: Secondary | ICD-10-CM

## 2022-02-03 DIAGNOSIS — Z1231 Encounter for screening mammogram for malignant neoplasm of breast: Secondary | ICD-10-CM

## 2022-02-03 MED ORDER — LEVOTHYROXINE SODIUM 50 MCG PO TABS: 50.0000 ug | ORAL_TABLET | Freq: Every day | ORAL | 1 refills | Status: DC

## 2022-02-03 MED ORDER — HYDROCHLOROTHIAZIDE 25 MG PO TABS: 25.0000 mg | ORAL_TABLET | Freq: Every day | ORAL | 3 refills | Status: DC

## 2022-02-03 MED ORDER — METOPROLOL TARTRATE 50 MG PO TABS: ORAL_TABLET | ORAL | 1 refills | Status: DC

## 2022-02-03 NOTE — Progress Notes (Signed)
? ? ?Date:  02/03/2022  ? ?Name:  Darlene Newman   DOB:  1961-07-27   MRN:  [id number] ? ? ?Chief Complaint: Annual Exam (Breast Exam. No pap.) ?Darlene Newman is a 61 y.o. female who presents today for her Complete Annual Exam. She feels well. She reports having a strenuous job. She reports she is sleeping fairly well. Breast complaints - none.  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  Will be going to [facility] for several weeks to train later this month. ? ?Mammogram: 01/2020 ?DEXA: none ?Pap smear: discontinued ?Colonoscopy: 10/2013 repeat 2025 ? ?Health Maintenance Due  ?Topic Date Due  ? Zoster Vaccines- Shingrix (1 of 2) Never done  ? MAMMOGRAM  02/18/2021  ? TETANUS/TDAP  10/16/2021  ?  ?Immunization History  ?Administered Date(s) Administered  ? Influenza,inj,Quad PF,6+ Mos 08/03/2020  ? Influenza-Unspecified 07/05/2015, 08/13/2017  ? PFIZER(Purple Top)SARS-COV-2 Vaccination 09/04/2020, 09/30/2020  ? ? ?Hypertension ?This is a chronic problem. The problem is uncontrolled (has been higher on several occasions and at home.). Pertinent negatives include no chest pain, headaches, palpitations or shortness of breath. Past treatments include angiotensin blockers and calcium channel blockers. The current treatment provides moderate improvement. There are no compliance problems.  There is no history of kidney disease, CAD/MI or CVA. Identifiable causes of hypertension include a thyroid problem.  ?Thyroid Problem ?Presents for follow-up visit. Patient reports no anxiety, constipation, diarrhea, fatigue, palpitations or tremors. The symptoms have been stable. Her past medical history is significant for hyperlipidemia.  ?Hyperlipidemia ?This is a chronic problem. The problem is controlled. Pertinent negatives include no chest pain or shortness of breath. Current antihyperlipidemic treatment includes statins. The current treatment provides significant improvement of lipids.   ?Gastroesophageal Reflux ?She complains of heartburn. She reports no abdominal pain, no chest pain, no coughing or no wheezing. This is a recurrent problem. Pertinent negatives include no fatigue. She has tried a PPI for the symptoms.  ? ?Lab Results  ?Component Value Date  ? NA 142 05/04/2021  ? K 3.5 05/04/2021  ? CO2 25 05/04/2021  ? GLUCOSE 108 (H) 05/04/2021  ? BUN 13 05/04/2021  ? CREATININE 1.01 (H) 05/04/2021  ? CALCIUM 9.4 05/04/2021  ? EGFR 64 05/04/2021  ? GFRNONAA 79 01/22/2020  ? ?Lab Results  ?Component Value Date  ? CHOL 183 05/04/2021  ? HDL 37 (L) 05/04/2021  ? LDLCALC 107 (H) 05/04/2021  ? TRIG 223 (H) 05/04/2021  ? CHOLHDL 4.9 (H) 05/04/2021  ? ?Lab Results  ?Component Value Date  ? TSH 6.610 (H) 05/04/2021  ? ?No results found for: HGBA1C ?Lab Results  ?Component Value Date  ? WBC 6.9 02/01/2021  ? HGB 13.0 02/01/2021  ? HCT 38.1 02/01/2021  ? MCV 94 02/01/2021  ? PLT 253 02/01/2021  ? ?Lab Results  ?Component Value Date  ? ALT 21 05/04/2021  ? AST 22 05/04/2021  ? ALKPHOS 130 (H) 05/04/2021  ? BILITOT 0.5 05/04/2021  ? ?No results found for: 25OHVITD2, [place], VD25OH  ? ?Review of Systems  ?Constitutional:  Negative for chills, fatigue and fever.  ?HENT:  Negative for congestion, hearing loss, tinnitus, trouble swallowing and voice change.   ?Eyes:  Negative for visual disturbance.  ?Respiratory:  Negative for cough, chest tightness, shortness of breath and wheezing.   ?Cardiovascular:  Negative for chest pain, palpitations and leg swelling.  ?Gastrointestinal:  Positive for heartburn. Negative for abdominal pain, constipation, diarrhea and vomiting.  ?  Endocrine: Negative for polydipsia and polyuria.  ?Genitourinary:  Negative for dysuria, frequency, genital sores, vaginal bleeding and vaginal discharge.  ?Musculoskeletal:  Negative for arthralgias, gait problem and joint swelling.  ?Skin:  Negative for color change and rash.  ?Neurological:  Negative for dizziness, tremors, light-headedness  and headaches.  ?Hematological:  Negative for adenopathy. Does not bruise/bleed easily.  ?Psychiatric/Behavioral:  Negative for dysphoric mood and sleep disturbance. The patient is not nervous/anxious.   ? ?Patient Active Problem List  ? Diagnosis Date Noted  ? H/O total vaginal hysterectomy 02/01/2022  ? Chronic left-sided low back pain with left-sided sciatica 08/03/2020  ? Atherosclerosis of aorta ([place]) 01/22/2020  ? Degenerative cervical disc 06/12/2017  ? Hypothyroidism due to acquired atrophy of thyroid 11/24/2016  ? Impingement syndrome of right shoulder 05/20/2015  ? Mixed hyperlipidemia 02/17/2015  ? Benign hypertension 02/17/2015  ? Climacteric 02/17/2015  ? History of fundoplication 24/46/2863  ? Tobacco use disorder, mild, in sustained remission 02/17/2015  ? Dermatitis contusiformis 05/31/2012  ? GERD without esophagitis 05/24/2012  ? Multiple lung nodules on CT 08/31/2011  ? ? ?Allergies  ?Allergen Reactions  ? Ibuprofen   ?  Reduced renal fxn  ? Amlodipine Swelling  ? ? ?Past Surgical History:  ?Procedure Laterality Date  ? COLONOSCOPY  2015  ? COLONOSCOPY WITH ESOPHAGOGASTRODUODENOSCOPY (EGD)  2015  ? GASTRIC FUNDOPLICATION    ? HIATAL HERNIA REPAIR  2015  ? TOTAL VAGINAL HYSTERECTOMY  2012  ? ? ?Social History  ? ?Tobacco Use  ? Smoking status: Former  ?  Packs/day: 1.50  ?  Years: 20.00  ?  Pack years: 30.00  ?  Types: Cigarettes  ?  Quit date: 10/30/2006  ?  Years since quitting: 15.2  ? Smokeless tobacco: Never  ?Vaping Use  ? Vaping Use: Never used  ?Substance Use Topics  ? Alcohol use: No  ?  Alcohol/week: 0.0 standard drinks  ? Drug use: No  ? ? ? ?Medication list has been reviewed and updated. ? ?Current Meds  ?Medication Sig  ? dicyclomine (BENTYL) 10 MG capsule TAKE 1 CAPSULE(10 MG) BY MOUTH IN THE MORNING AND AT BEDTIME  ? hydrochlorothiazide (HYDRODIURIL) 25 MG tablet Take 1 tablet (25 mg total) by mouth daily.  ? losartan (COZAAR) 100 MG tablet TAKE 1 TABLET(100 MG) BY MOUTH DAILY  ?  omeprazole (PRILOSEC) 40 MG capsule TAKE 1 CAPSULE(40 MG) BY MOUTH DAILY  ? PARoxetine (PAXIL) 20 MG tablet TAKE 1 TABLET(20 MG) BY MOUTH DAILY  ? rosuvastatin (CRESTOR) 5 MG tablet TAKE 1 TABLET(5 MG) BY MOUTH DAILY  ? [DISCONTINUED] levothyroxine (SYNTHROID) 50 MCG tablet TAKE 1 TABLET BY MOUTH DAILY BEFORE BREAKFAST  ? [DISCONTINUED] metoprolol tartrate (LOPRESSOR) 50 MG tablet TAKE 1 TABLET(50 MG) BY MOUTH TWICE DAILY  ? ? ? ?  05/04/2021  ?  8:16 AM 03/03/2021  ?  8:26 AM 02/01/2021  ?  7:54 AM 08/03/2020  ?  8:08 AM  ?GAD 7 : Generalized Anxiety Score  ?Nervous, Anxious, on Edge 0 0 0 0  ?Control/stop worrying 0 0 0 0  ?Worry too much - different things 0 0 0 0  ?Trouble relaxing 0 0 0 0  ?Restless 0 0 0 0  ?Easily annoyed or irritable 0 0 0 0  ?Afraid - awful might happen 0 0 0 0  ?Total GAD 7 Score 0 0 0 0  ?Anxiety Difficulty Not difficult at all Not difficult at all    ? ? ? ?  05/04/2021  ?  8:15 AM  ?Depression screen PHQ 2/9  ?Decreased Interest 0  ?Down, Depressed, Hopeless 0  ?PHQ - 2 Score 0  ?Altered sleeping 0  ?Tired, decreased energy 0  ?Change in appetite 0  ?Feeling bad or failure about yourself  0  ?Trouble concentrating 0  ?Moving slowly or fidgety/restless 0  ?Suicidal thoughts 0  ?PHQ-9 Score 0  ?Difficult doing work/chores Not difficult at all  ? ? ?BP Readings from Last 3 Encounters:  ?02/03/22 (!) 155/90  ?05/04/21 134/82  ?03/03/21 (!) 144/98  ? ? ?Physical Exam ?Vitals and nursing note reviewed.  ?Constitutional:   ?   General: She is not in acute distress. ?   Appearance: She is well-developed.  ?HENT:  ?   Head: Normocephalic and atraumatic.  ?   Right Ear: Tympanic membrane and ear canal normal.  ?   Left Ear: Tympanic membrane and ear canal normal.  ?   Nose:  ?   Right Sinus: No maxillary sinus tenderness.  ?   Left Sinus: No maxillary sinus tenderness.  ?Eyes:  ?   General: No scleral icterus.    ?   Right eye: No discharge.     ?   Left eye: No discharge.  ?   Conjunctiva/sclera:  Conjunctivae normal.  ?Neck:  ?   Thyroid: No thyromegaly.  ?   Vascular: No carotid bruit.  ?Cardiovascular:  ?   Rate and Rhythm: Normal rate and regular rhythm.  ?   Pulses: Normal pulses.  ?   Heart sounds: Nor

## 2022-02-04 LAB — COMPREHENSIVE METABOLIC PANEL
ALT: 22 IU/L (ref 0–32)
AST: 25 IU/L (ref 0–40)
Albumin/Globulin Ratio: 1.9 (ref 1.2–2.2)
Albumin: 4.3 g/dL (ref 3.8–4.9)
Alkaline Phosphatase: 128 IU/L — ABNORMAL HIGH (ref 44–121)
BUN/Creatinine Ratio: 12 (ref 12–28)
BUN: 10 mg/dL (ref 8–27)
Bilirubin Total: 0.6 mg/dL (ref 0.0–1.2)
CO2: 25 mmol/L (ref 20–29)
Calcium: 9 mg/dL (ref 8.7–10.3)
Chloride: 102 mmol/L (ref 96–106)
Creatinine, Ser: 0.85 mg/dL (ref 0.57–1.00)
Globulin, Total: 2.3 g/dL (ref 1.5–4.5)
Glucose: 95 mg/dL (ref 70–99)
Potassium: 3.5 mmol/L (ref 3.5–5.2)
Sodium: 143 mmol/L (ref 134–144)
Total Protein: 6.6 g/dL (ref 6.0–8.5)
eGFR: 78 mL/min/{1.73_m2} (ref >59)

## 2022-02-04 LAB — LIPID PANEL
Chol/HDL Ratio: 4.9 ratio — ABNORMAL HIGH (ref 0.0–4.4)
Cholesterol, Total: 176 mg/dL (ref 100–199)
HDL: 36 mg/dL — ABNORMAL LOW (ref >39)
LDL Chol Calc (NIH): 103 mg/dL — ABNORMAL HIGH (ref 0–99)
Triglycerides: 212 mg/dL — ABNORMAL HIGH (ref 0–149)
VLDL Cholesterol Cal: 37 mg/dL (ref 5–40)

## 2022-02-04 LAB — CBC WITH DIFFERENTIAL/PLATELET
Basophils Absolute: 0 10*3/uL (ref 0.0–0.2)
Basos: 1 %
EOS (ABSOLUTE): 0.2 10*3/uL (ref 0.0–0.4)
Eos: 3 %
Hematocrit: 36.6 % (ref 34.0–46.6)
Hemoglobin: 12.9 g/dL (ref 11.1–15.9)
Immature Grans (Abs): 0 10*3/uL (ref 0.0–0.1)
Immature Granulocytes: 0 %
Lymphocytes Absolute: 1.1 10*3/uL (ref 0.7–3.1)
Lymphs: 19 %
MCH: 32.9 pg (ref 26.6–33.0)
MCHC: 35.2 g/dL (ref 31.5–35.7)
MCV: 93 fL (ref 79–97)
Monocytes Absolute: 0.4 10*3/uL (ref 0.1–0.9)
Monocytes: 7 %
Neutrophils Absolute: 3.9 10*3/uL (ref 1.4–7.0)
Neutrophils: 70 %
Platelets: 251 10*3/uL (ref 150–450)
RBC: 3.92 x10E6/uL (ref 3.77–5.28)
RDW: 12.9 % (ref 11.7–15.4)
WBC: 5.6 10*3/uL (ref 3.4–10.8)

## 2022-02-04 LAB — TSH+FREE T4
Free T4: 1.4 ng/dL (ref 0.82–1.77)
TSH: 4.65 u[IU]/mL — ABNORMAL HIGH (ref 0.450–4.500)

## 2022-02-26 ENCOUNTER — Ambulatory Visit
Admission: EM | Admit: 2022-02-26 | Discharge: 2022-02-26 | Disposition: A | Discharge status: Home / Self Care | Payer: Commercial Managed Care - PPO | Attending: Physician Assistant | Admitting: Physician Assistant

## 2022-02-26 ENCOUNTER — Other Ambulatory Visit: Payer: Self-pay

## 2022-02-26 DIAGNOSIS — U071 COVID-19: Secondary | ICD-10-CM | POA: Diagnosis not present

## 2022-02-26 MED ORDER — ONDANSETRON 4 MG PO TBDP
4.0000 mg | ORAL_TABLET | Freq: Three times a day (TID) | ORAL | 0 refills | Status: AC | PRN
Start: 2022-02-26 — End: 2022-03-03

## 2022-02-26 MED ORDER — ACETAMINOPHEN 325 MG PO TABS
650.0000 mg | ORAL_TABLET | Freq: Once | ORAL | Status: AC
  Administered 2022-02-26: 650 mg via ORAL

## 2022-02-26 MED ORDER — BENZONATATE 100 MG PO CAPS: 100.0000 mg | ORAL_CAPSULE | Freq: Three times a day (TID) | ORAL | 0 refills | Status: AC | PRN

## 2022-02-26 NOTE — ED Provider Notes (Signed)
? ? ?Provider Note ? ?Patient Contact: 10:53 AM (approximate) ? ? ?History  ? ?Fever and Cough (Covid positive) ? ? ?HPI ? ?Darlene Newman is a 61 y.o. female presents to the urgent care with cough, low-grade fever, vomiting and headache.  Patient was diagnosed with COVID-19 yesterday and she has been symptomatic since Thursday.  She denies chest pain, chest tightness or abdominal pain. ? ?  ? ? ?Physical Exam  ? ?Triage Vital Signs: ?ED Triage Vitals  ?Enc Vitals Group  ?   BP 02/26/22 0951 114/77  ?   Pulse Rate 02/26/22 0951 85  ?   Resp 02/26/22 0951 14  ?   Temp 02/26/22 0951 (!) 100.8 ?F (38.2 ?C)  ?   Temp Source 02/26/22 0951 Oral  ?   SpO2 02/26/22 0951 97 %  ?   Weight 02/26/22 0953 135 lb (61.2 kg)  ?   Height 02/26/22 0953 5\' 2"  (1.575 m)  ?   Head Circumference --   ?   Peak Flow --   ?   Pain Score 02/26/22 0952 5  ?   Pain Loc --   ?   Pain Edu? --   ?   Excl. in [place]? --   ? ? ?Most recent vital signs: ?Vitals:  ? 02/26/22 0951  ?BP: 114/77  ?Pulse: 85  ?Resp: 14  ?Temp: (!) 100.8 ?F (38.2 ?C)  ?SpO2: 97%  ? ? ? ?Constitutional: Alert and oriented. Patient is lying supine. ?Eyes: Conjunctivae are normal. PERRL. EOMI. ?Head: Atraumatic. ?ENT: ?     Ears: Tympanic membranes are mildly injected with mild effusion bilaterally.  ?     Nose: No congestion/rhinnorhea. ?     Mouth/Throat: Mucous membranes are moist. Posterior pharynx is mildly erythematous.  ?Hematological/Lymphatic/Immunilogical: No cervical lymphadenopathy.  ?Cardiovascular: Normal rate, regular rhythm. Normal S1 and S2.  Good peripheral circulation. ?Respiratory: Normal respiratory effort without tachypnea or retractions. Lungs CTAB. Good air entry to the bases with no decreased or absent breath sounds. ?Gastrointestinal: Bowel sounds ?4 quadrants. Soft and nontender to palpation. No guarding or rigidity. No palpable masses. No distention. No CVA tenderness. ?Musculoskeletal: Full range of motion to all extremities. No gross deformities  appreciated. ?Neurologic:  Normal speech and language. No gross focal neurologic deficits are appreciated.  ?Skin:  Skin is warm, dry and intact. No rash noted. ?Psychiatric: Mood and affect are normal. Speech and behavior are normal. Patient exhibits appropriate insight and judgement. ? ? ?ED Results / Procedures / Treatments  ? ?Labs ?(all labs ordered are listed, but only abnormal results are displayed) ?Labs Reviewed - No data to display ? ? ? ? ? ?PROCEDURES: ? ?Critical Care performed: No ? ?Procedures ? ? ?MEDICATIONS ORDERED IN ED: ?Medications  ?acetaminophen (TYLENOL) tablet 650 mg (650 mg Oral Given 02/26/22 0958)  ? ? ? ?IMPRESSION / MDM / ASSESSMENT AND PLAN / ED COURSE  ?I reviewed the triage vital signs and the nursing notes. ?             ?               ? ?Assessment and plan ?COVID-19 ?37-year-old female presents to the urgent care with low-grade fever, cough, nasal congestion and vomiting. ? ?Patient had low-grade fever at triage but vital signs otherwise reassuring.  Sent patient and Zofran and Tessalon Perles for vomiting and cough and a work note was provided per her request.  Return precautions were given to return with new  or worsening symptoms. ? ? ?FINAL CLINICAL IMPRESSION(S) / ED DIAGNOSES  ? ?Final diagnoses:  ?COVID-19  ? ? ? ?Rx / DC Orders  ? ?ED Discharge Orders   ? ?      Ordered  ?  ondansetron (ZOFRAN-ODT) 4 MG disintegrating tablet  Every 8 hours PRN       ? 02/26/22 1053  ?  benzonatate (TESSALON PERLES) 100 MG capsule  3 times daily PRN       ? 02/26/22 1053  ? ?  ?  ? ?  ? ? ? ?Note:  This document was prepared using Dragon voice recognition software and may include unintentional dictation errors. ?  ?Lannie Fields, PA-C ?02/26/22 1055 ? ?

## 2022-02-26 NOTE — ED Triage Notes (Addendum)
Patient C/O of coughing runny nose, body aches, fever since last Thursday 02/23/22. Patient stated she tested positive for covid yesterday. ?

## 2022-02-26 NOTE — Discharge Instructions (Addendum)
You can take 2 Tessalon Perles at night before bed. ?You can take Zofran up to every 8 hours as needed for nausea and vomiting. ?

## 2022-03-17 ENCOUNTER — Ambulatory Visit: Payer: Commercial Managed Care - PPO | Admitting: Internal Medicine

## 2022-03-17 ENCOUNTER — Encounter: Payer: Self-pay | Admitting: Internal Medicine

## 2022-03-17 VITALS — BP 120/78 | HR 68 | Ht 62.0 in | Wt 141.0 lb

## 2022-03-17 DIAGNOSIS — I10 Essential (primary) hypertension: Secondary | ICD-10-CM | POA: Diagnosis not present

## 2022-03-17 DIAGNOSIS — E781 Pure hyperglyceridemia: Secondary | ICD-10-CM

## 2022-03-17 MED ORDER — PAROXETINE HCL 20 MG PO TABS: ORAL_TABLET | ORAL | 1 refills | Status: DC

## 2022-03-17 MED ORDER — ROSUVASTATIN CALCIUM 5 MG PO TABS: ORAL_TABLET | ORAL | 1 refills | Status: DC

## 2022-03-17 MED ORDER — LOSARTAN POTASSIUM 100 MG PO TABS: ORAL_TABLET | ORAL | 1 refills | Status: DC

## 2022-03-17 NOTE — Progress Notes (Signed)
Date:  03/17/2022   Name:  Darlene Newman   DOB:  1961-05-14   MRN:  [id number]   Chief Complaint: Hypertension  Hypertension This is a chronic problem. The problem is controlled. Pertinent negatives include no chest pain, headaches, palpitations or shortness of breath. Past treatments include angiotensin blockers, diuretics and beta blockers (hctz added last visit). There is no history of kidney disease, CAD/MI or CVA.  She feels well, no side effects to medications. Returned from [facility] training and is now on first shift rather than third.  She prefers third. Recovered from Covid infection three weeks ago.   Still has a slight cough but no production, no shortness of breath or chest pain.   Lab Results  Component Value Date   NA 143 02/03/2022   K 3.5 02/03/2022   CO2 25 02/03/2022   GLUCOSE 95 02/03/2022   BUN 10 02/03/2022   CREATININE 0.85 02/03/2022   CALCIUM 9.0 02/03/2022   EGFR 78 02/03/2022   GFRNONAA 79 01/22/2020   Lab Results  Component Value Date   CHOL 176 02/03/2022   HDL 36 (L) 02/03/2022   LDLCALC 103 (H) 02/03/2022   TRIG 212 (H) 02/03/2022   CHOLHDL 4.9 (H) 02/03/2022   Lab Results  Component Value Date   TSH 4.650 (H) 02/03/2022   No results found for: HGBA1C Lab Results  Component Value Date   WBC 5.6 02/03/2022   HGB 12.9 02/03/2022   HCT 36.6 02/03/2022   MCV 93 02/03/2022   PLT 251 02/03/2022   Lab Results  Component Value Date   ALT 22 02/03/2022   AST 25 02/03/2022   ALKPHOS 128 (H) 02/03/2022   BILITOT 0.6 02/03/2022   No results found for: 25OHVITD2, 25OHVITD3, VD25OH   Review of Systems  Constitutional:  Negative for fatigue and unexpected weight change.  HENT:  Negative for nosebleeds.   Eyes:  Negative for visual disturbance.  Respiratory:  Negative for cough, chest tightness, shortness of breath and wheezing.   Cardiovascular:  Negative for chest pain, palpitations and leg swelling.  Gastrointestinal:  Negative for  abdominal pain, constipation and diarrhea.  Neurological:  Negative for dizziness, weakness, light-headedness and headaches.   Patient Active Problem List   Diagnosis Date Noted   H/O total vaginal hysterectomy 02/01/2022   Chronic left-sided low back pain with left-sided sciatica 08/03/2020   Atherosclerosis of aorta ([place]) 01/22/2020   Degenerative cervical disc 06/12/2017   Hypothyroidism due to acquired atrophy of thyroid 11/24/2016   Impingement syndrome of right shoulder 05/20/2015   Mixed hyperlipidemia 02/17/2015   Benign hypertension 02/17/2015   Climacteric 02/17/2015   History of fundoplication 75/44/9201   Tobacco use disorder, mild, in sustained remission 02/17/2015   Dermatitis contusiformis 05/31/2012   GERD without esophagitis 05/24/2012   Multiple lung nodules on CT 08/31/2011    Allergies  Allergen Reactions   Ibuprofen     Reduced renal fxn   Amlodipine Swelling    Past Surgical History:  Procedure Laterality Date   COLONOSCOPY  2015   COLONOSCOPY WITH ESOPHAGOGASTRODUODENOSCOPY (EGD)  2015   GASTRIC FUNDOPLICATION     HIATAL HERNIA REPAIR  2015   TOTAL VAGINAL HYSTERECTOMY  2012    Social History   Tobacco Use   Smoking status: Former    Packs/day: 1.50    Years: 20.00    Pack years: 30.00    Types: Cigarettes    Quit date: 10/30/2006    Years since quitting: 45.3  Smokeless tobacco: Never  Vaping Use   Vaping Use: Never used  Substance Use Topics   Alcohol use: No    Alcohol/week: 0.0 standard drinks   Drug use: No     Medication list has been reviewed and updated.  Current Meds  Medication Sig   dicyclomine (BENTYL) 10 MG capsule TAKE 1 CAPSULE(10 MG) BY MOUTH IN THE MORNING AND AT BEDTIME   hydrochlorothiazide (HYDRODIURIL) 25 MG tablet Take 1 tablet (25 mg total) by mouth daily.   levothyroxine (SYNTHROID) 50 MCG tablet Take 1 tablet (50 mcg total) by mouth daily before breakfast.   losartan (COZAAR) 100 MG tablet TAKE 1 TABLET(100  MG) BY MOUTH DAILY   metoprolol tartrate (LOPRESSOR) 50 MG tablet TAKE 1 TABLET(50 MG) BY MOUTH TWICE DAILY   omeprazole (PRILOSEC) 40 MG capsule TAKE 1 CAPSULE(40 MG) BY MOUTH DAILY   PARoxetine (PAXIL) 20 MG tablet TAKE 1 TABLET(20 MG) BY MOUTH DAILY   rosuvastatin (CRESTOR) 5 MG tablet TAKE 1 TABLET(5 MG) BY MOUTH DAILY       03/17/2022    9:38 AM 05/04/2021    8:16 AM 03/03/2021    8:26 AM 02/01/2021    7:54 AM  GAD 7 : Generalized Anxiety Score  Nervous, Anxious, on Edge 0 0 0 0  Control/stop worrying 0 0 0 0  Worry too much - different things 0 0 0 0  Trouble relaxing 0 0 0 0  Restless 0 0 0 0  Easily annoyed or irritable 0 0 0 0  Afraid - awful might happen 1 0 0 0  Total GAD 7 Score 1 0 0 0  Anxiety Difficulty Not difficult at all Not difficult at all Not difficult at all        03/17/2022    9:38 AM  Depression screen PHQ 2/9  Decreased Interest 0  Down, Depressed, Hopeless 0  PHQ - 2 Score 0  Altered sleeping 2  Tired, decreased energy 0  Change in appetite 0  Feeling bad or failure about yourself  0  Trouble concentrating 0  Moving slowly or fidgety/restless 0  Suicidal thoughts 0  PHQ-9 Score 2  Difficult doing work/chores Not difficult at all    BP Readings from Last 3 Encounters:  03/17/22 120/78  02/26/22 114/77  02/03/22 (!) 155/90    Physical Exam Vitals and nursing note reviewed.  Constitutional:      General: She is not in acute distress.    Appearance: She is well-developed.  HENT:     Head: Normocephalic and atraumatic.  Cardiovascular:     Rate and Rhythm: Normal rate and regular rhythm.  Pulmonary:     Effort: Pulmonary effort is normal. No respiratory distress.     Breath sounds: No wheezing or rhonchi.  Musculoskeletal:     Cervical back: Normal range of motion.     Right lower leg: No edema.     Left lower leg: No edema.  Skin:    General: Skin is warm and dry.     Capillary Refill: Capillary refill takes less than 2 seconds.      Findings: No rash.  Neurological:     General: No focal deficit present.     Mental Status: She is alert and oriented to person, place, and time.  Psychiatric:        Mood and Affect: Mood normal.        Behavior: Behavior normal.    Wt Readings from Last 3 Encounters:  03/17/22  141 lb (64 kg)  02/26/22 135 lb (61.2 kg)  02/03/22 140 lb (63.5 kg)    BP 120/78   Pulse 68   Ht _0  (1.575 m)   Wt 141 lb (64 kg)   SpO2 97%   BMI 25.79 kg/m   Assessment and Plan: 1. Benign hypertension Clinically stable exam with well controlled BP since adding HCTZ. Tolerating medications without side effects at this time. Pt to continue current regimen and low sodium diet; benefits of regular exercise as able discussed. - losartan (COZAAR) 100 MG tablet; TAKE 1 TABLET(100 MG) BY MOUTH DAILY  Dispense: 90 tablet; Refill: 1  2. Hypertriglyceridemia Doing well with good LDL reduction. - rosuvastatin (CRESTOR) 5 MG tablet; TAKE 1 TABLET(5 MG) BY MOUTH DAILY  Dispense: 90 tablet; Refill: 1   Partially dictated using Editor, commissioning. Any errors are unintentional.  Halina Maidens, MD [facility]  03/17/2022

## 2022-04-07 IMAGING — CT CT CHEST W/O CM
2 of 4 series · 15 of 36 positions shown, 18 images · non-contrast
Comparison: 03/21/2019

CLINICAL DATA: Pulmonary nodule follow up

EXAM:
CT CHEST WITHOUT CONTRAST
TECHNIQUE: Multidetector CT imaging of the chest was performed following the
standard protocol without IV contrast.

[Series 2: chest 2.00 · axial · 0.66mm/px · z∈[-1136,-886]mm · 12 of 149 slices shown, 15 images]
[im 12/149  mediastinal]
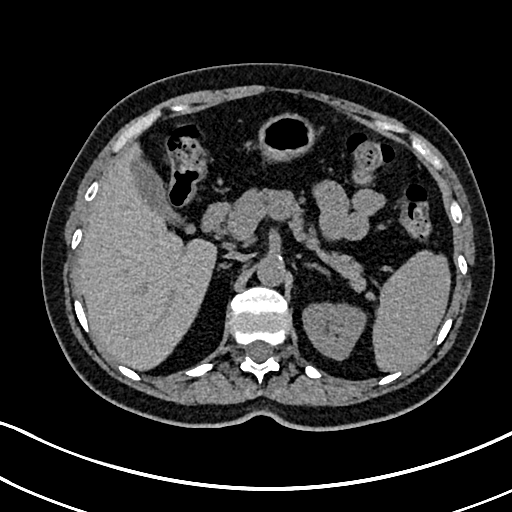
[im 12/149  lung]
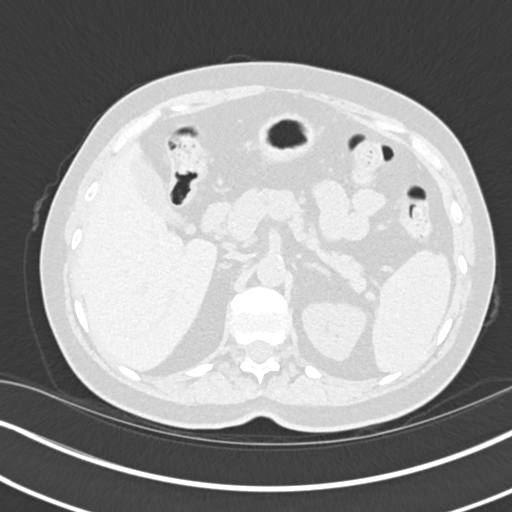
[im 23/149  lung]
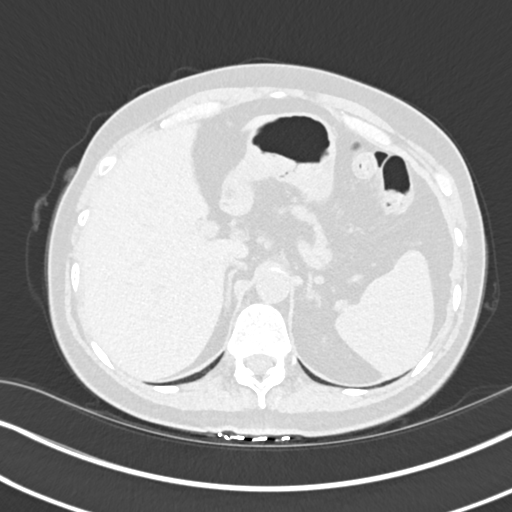
[im 35/149  lung]
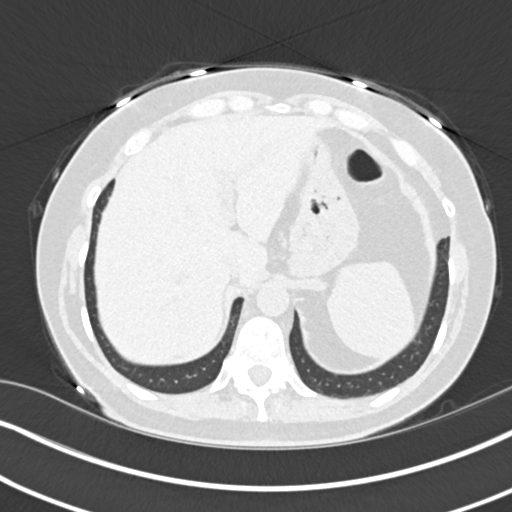
[im 46/149  lung]
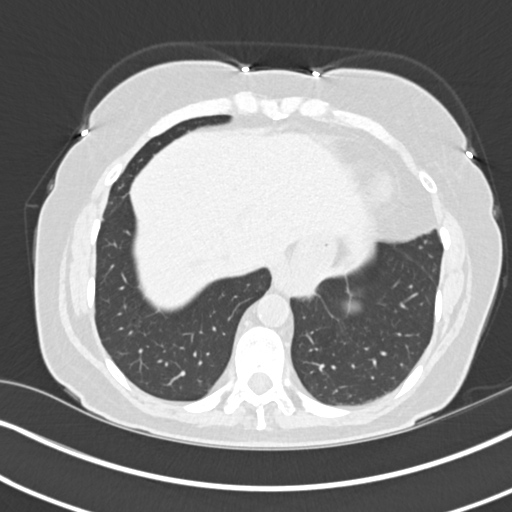
[im 57/149  mediastinal]
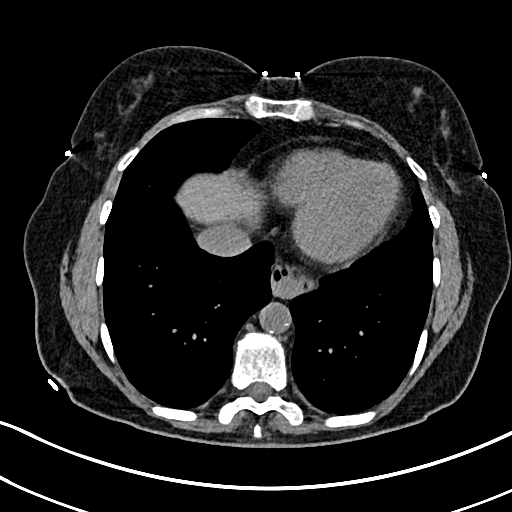
[im 57/149  lung]
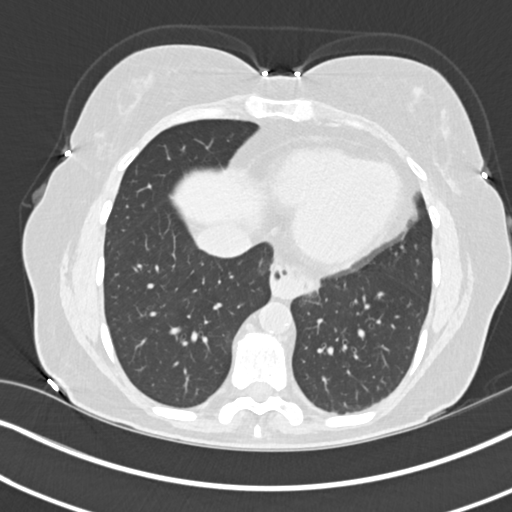
[im 69/149  lung]
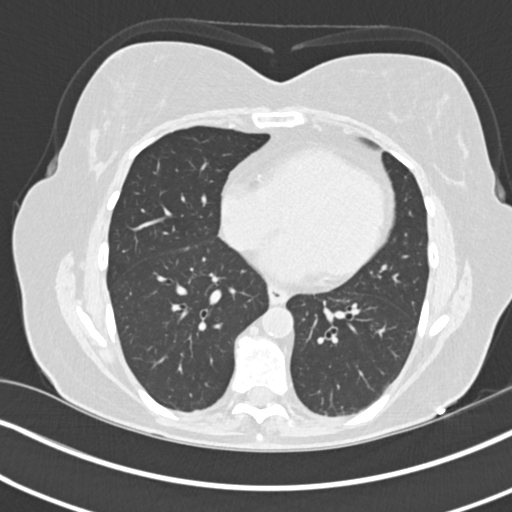
[im 80/149  lung]
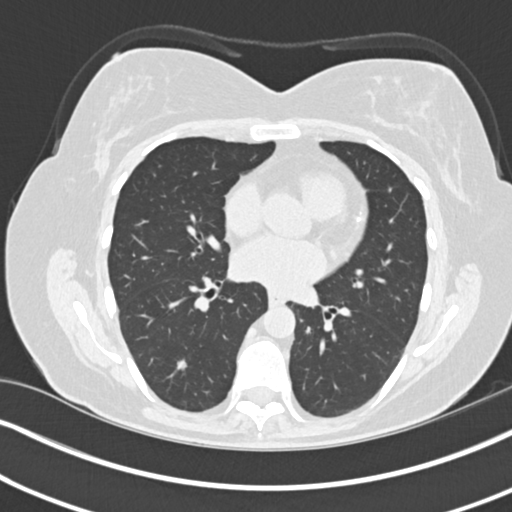
[im 92/149  lung]
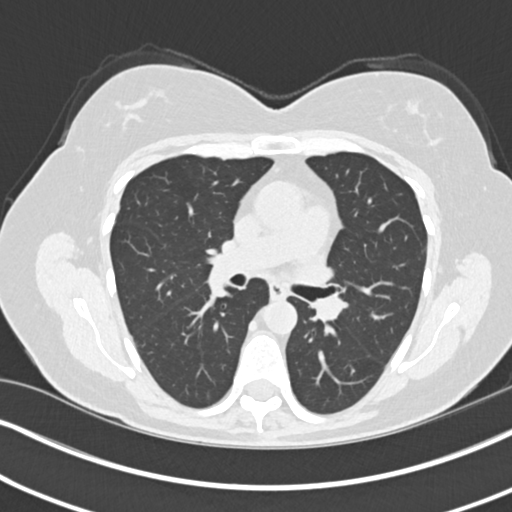
[im 103/149  mediastinal]
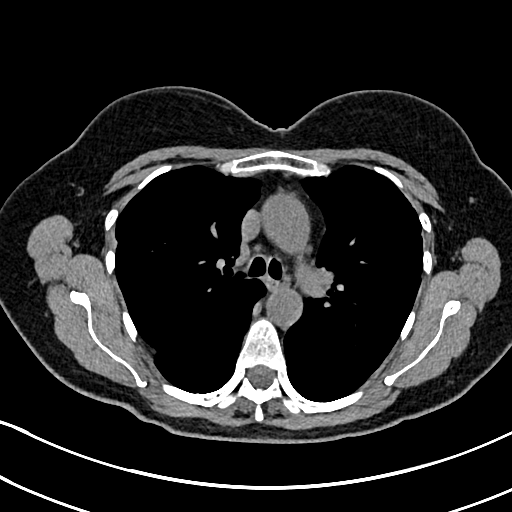
[im 103/149  lung]
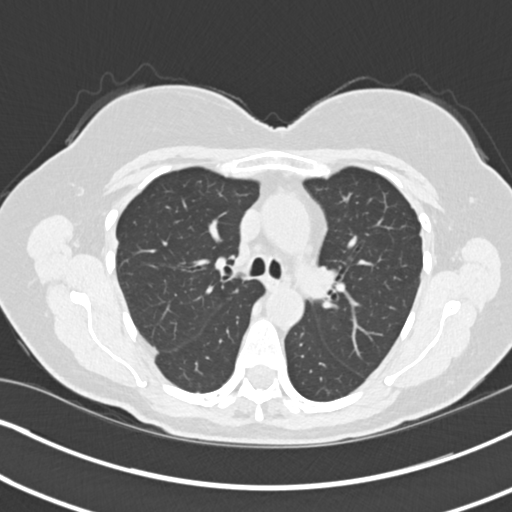
[im 114/149  lung]
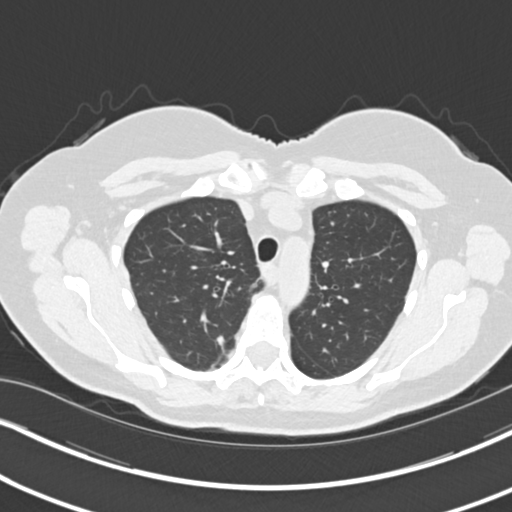
[im 126/149  lung]
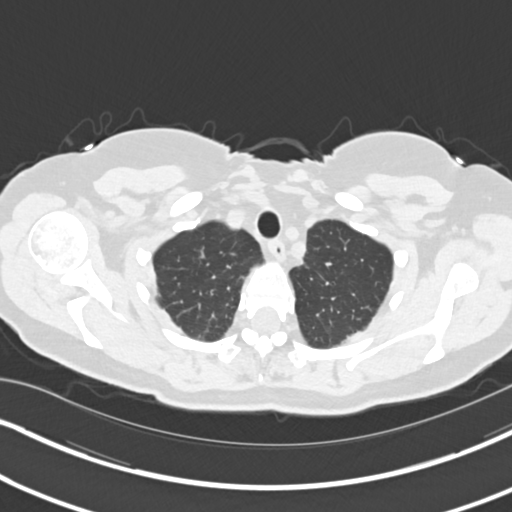
[im 137/149  lung]
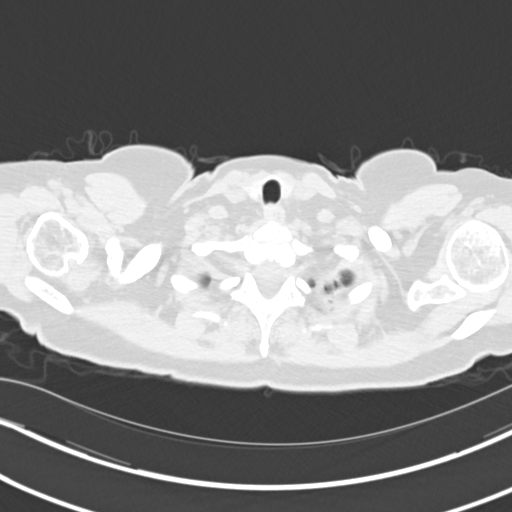

[Series 5: coronals chest 2.00 cor · coronal · 0.58mm/px · 3 of 131 slices shown]
[im 27/131  lung]
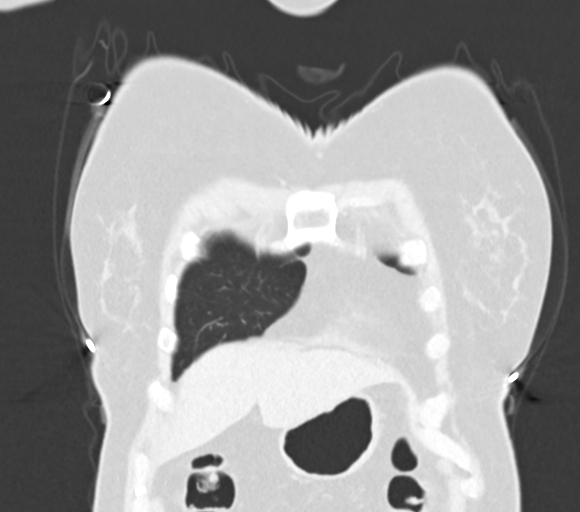
[im 53/131  lung]
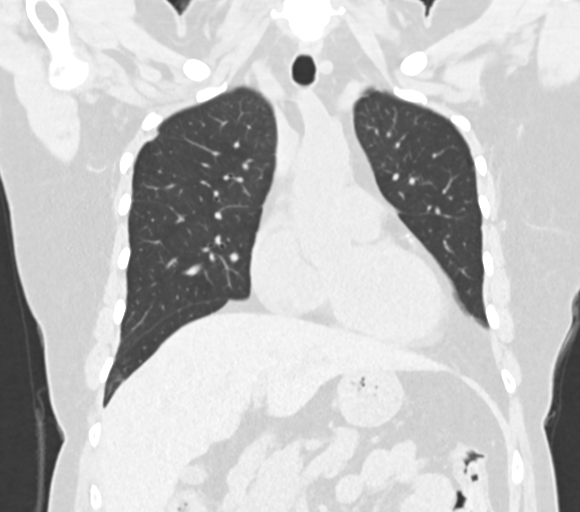
[im 79/131  lung]
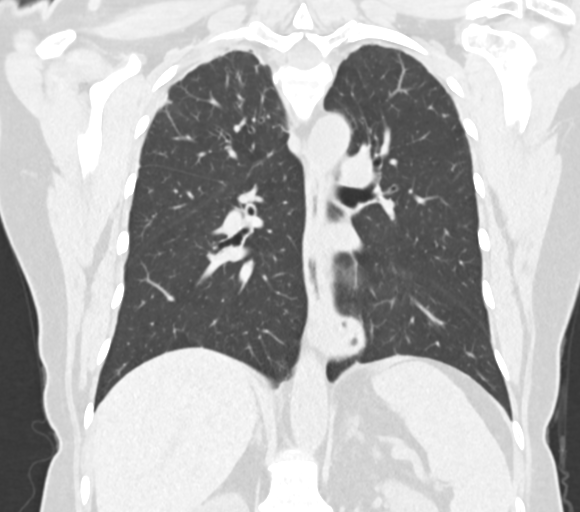

[15 of 36 positions shown; findings below may reference images not displayed]

FINDINGS: Cardiovascular: Coronary, aortic arch, and branch vessel
atherosclerotic vascular disease.

Mediastinum/Nodes: Small hiatal hernia appears to contain a
fundoplication. No pathologic adenopathy identified.

Lungs/Pleura: Biapical pleuroparenchymal scarring.

A variety of small pulmonary nodules in both lungs are stable. These
include:

0.6 by 0.5 by 0.3 cm right lower lobe pulmonary nodule on image
70/3.

0.4 by 0.5 by 0.5 cm right lower lobe pulmonary nodule on image
55/3.

0.4 by 0.5 by 0.4 cm subpleural nodule along the major fissure in
the right lower lobe on image 51/3.

0.4 by 0.3 by 0.4 cm right upper lobe nodule on image [DATE].

0.5 by 0.2 by 0.4 cm left upper lobe nodule on image 41/3.

Upper Abdomen: Unremarkable

Musculoskeletal: Mild thoracic kyphosis.
IMPRESSION: 1. A variety of small pulmonary nodules in both lungs are stable and
considered benign. No further workup required.
2. Coronary, aortic arch, and branch vessel atherosclerotic vascular
disease.
3. Small hiatal hernia appears to contain a fundoplication.
4. Mild thoracic kyphosis.
5. Aortic atherosclerosis.

Aortic Atherosclerosis ([id number]).

## 2022-04-30 ENCOUNTER — Other Ambulatory Visit: Payer: Self-pay | Admitting: Internal Medicine

## 2022-04-30 DIAGNOSIS — K219 Gastro-esophageal reflux disease without esophagitis: Secondary | ICD-10-CM

## 2022-05-01 NOTE — Telephone Encounter (Signed)
Requested Prescriptions  Pending Prescriptions Disp Refills  . dicyclomine (BENTYL) 10 MG capsule [Pharmacy Med Name: DICYCLOMINE 10MG  CAPSULES] 180 capsule 3    Sig: TAKE 1 CAPSULE(10 MG) BY MOUTH IN THE MORNING AND AT BEDTIME     Gastroenterology:  Antispasmodic Agents Passed - 04/30/2022  5:39 PM      Passed - Valid encounter within last 12 months    Recent Outpatient Visits          1 month ago Benign hypertension   [facility] 07/01/2022, MD   2 months ago Annual physical exam   [facility], MD   12 months ago Benign hypertension   [facility], MD   1 year ago Benign hypertension   [facility] Reubin Milan, MD   1 year ago Annual physical exam   [facility], MD      Future Appointments            In 9 months Reubin Milan Judithann Graves, MD [facility], [facility]

## 2022-07-03 ENCOUNTER — Other Ambulatory Visit: Payer: Self-pay | Admitting: Internal Medicine

## 2022-07-03 DIAGNOSIS — K219 Gastro-esophageal reflux disease without esophagitis: Secondary | ICD-10-CM

## 2022-07-05 NOTE — Telephone Encounter (Signed)
Requested Prescriptions  Pending Prescriptions Disp Refills  . omeprazole (PRILOSEC) 40 MG capsule [Pharmacy Med Name: OMEPRAZOLE 40MG  CAPSULES] 90 capsule 2    Sig: TAKE 1 CAPSULE(40 MG) BY MOUTH DAILY     Gastroenterology: Proton Pump Inhibitors Passed - 07/03/2022  5:28 PM      Passed - Valid encounter within last 12 months    Recent Outpatient Visits          3 months ago Benign hypertension   [facility], [facility], MD   5 months ago Annual physical exam   [facility] [place] [facility] at [facility], [facility], MD   1 year ago Benign hypertension   [place] [facility] at [facility], [facility], MD   1 year ago Benign hypertension    [facility] at [facility], [facility], MD   1 year ago Annual physical exam   [facility] at [facility], [facility], MD      Future Appointments            In 7 months Nyoka Cowden, Judithann Graves, MD [facility] at [facility], [facility]

## 2022-07-19 IMAGING — MR MR LUMBAR SPINE W/O CM
5 series · 31 of 48 positions shown · non-contrast
Comparison: None.

CLINICAL DATA: Low back pain with left leg pain since January 2020

EXAM:
MRI LUMBAR SPINE WITHOUT CONTRAST
TECHNIQUE: Multiplanar, multisequence MR imaging of the lumbar spine was
performed. No intravenous contrast was administered.

[Series 5: T2 · sagittal · 4.0mm · 0.81mm/px · 6 of 17 slices shown (1 of 2)]
[im 1/17]
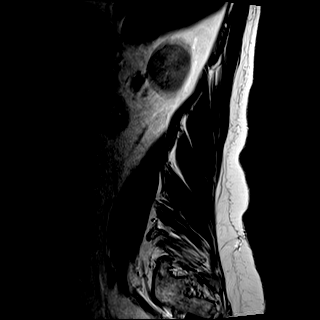
[im 4/17]
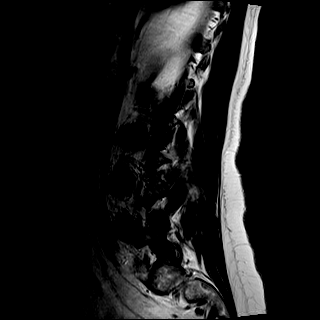
[im 7/17]
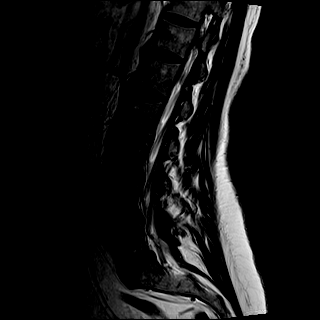
[im 10/17]
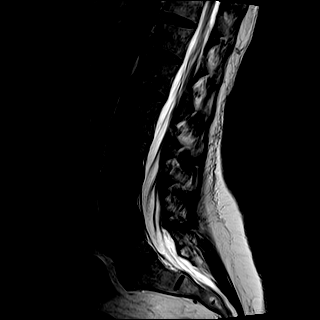
[im 13/17]
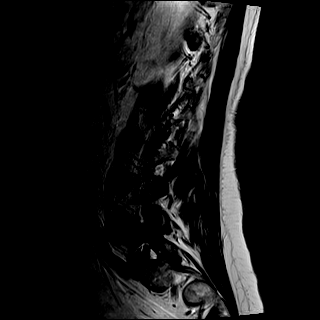
[im 17/17]
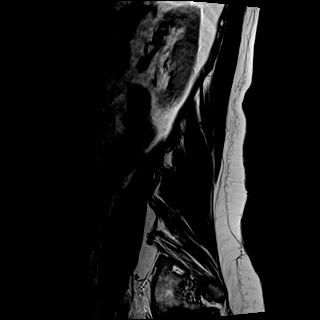

[Series 6: T1 · sagittal · 4.0mm · 0.81mm/px · 7 of 17 slices shown (1 of 2)]
[im 1/17]
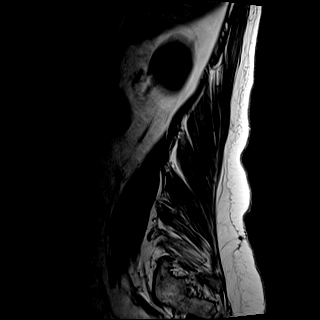
[im 3/17]
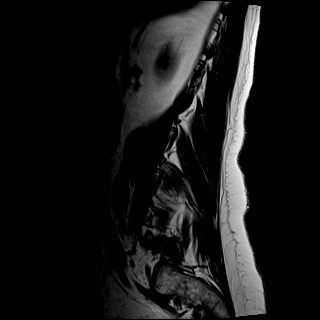
[im 6/17]
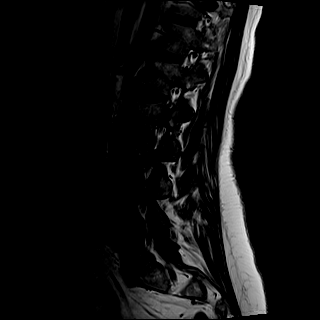
[im 9/17]
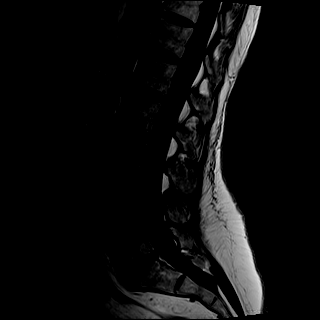
[im 11/17]
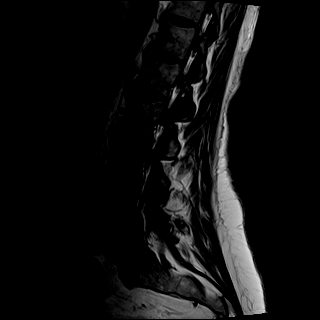
[im 14/17]
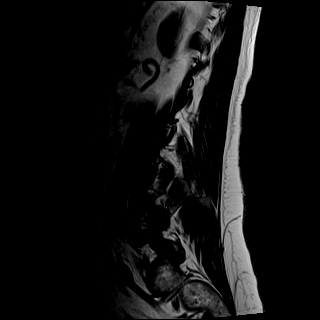
[im 17/17]
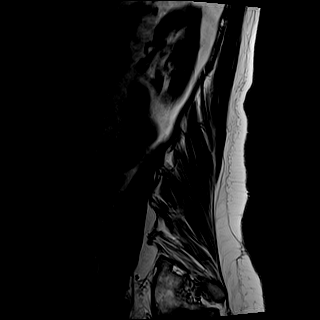

[Series 7: STIR · sagittal · 4.0mm · 0.41mm/px · 2 of 17 slices shown]
[im 1/17]
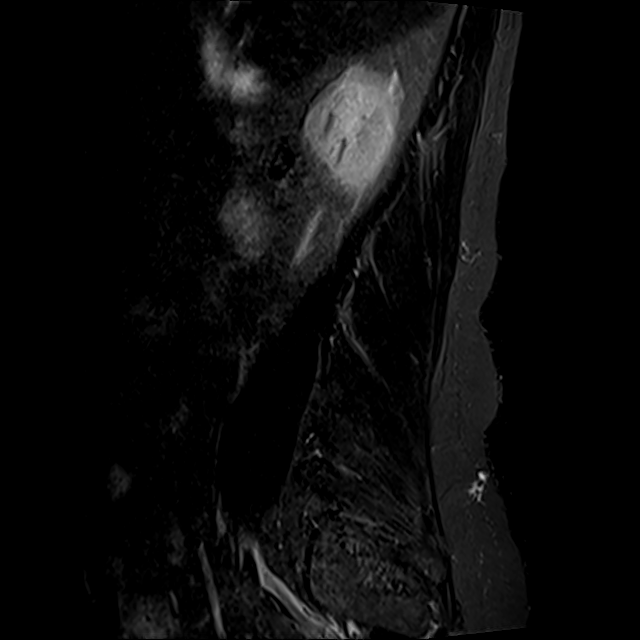
[im 3/17]
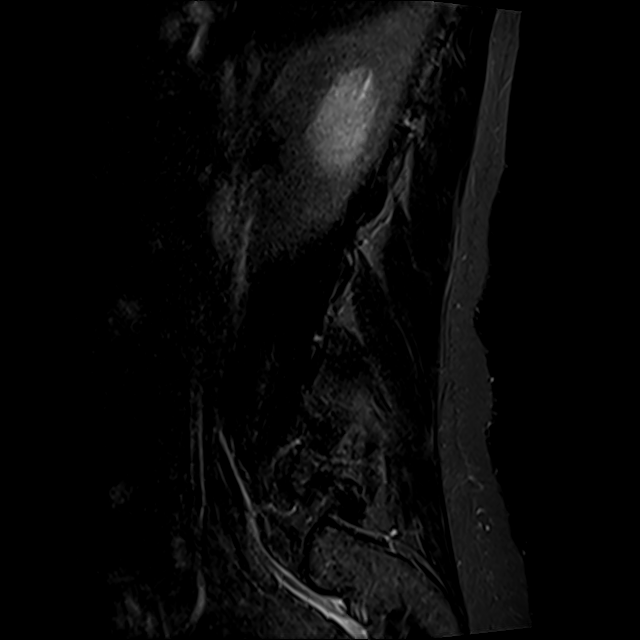

[Series 8: T2 · axial · 4.0mm · 0.78mm/px · z∈[-99,+128]mm · 8 of 36 slices shown (2 of 2)]
[im 1/36]
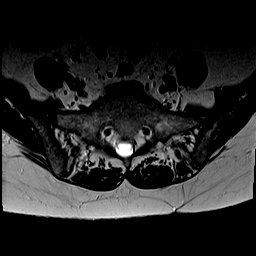
[im 6/36]
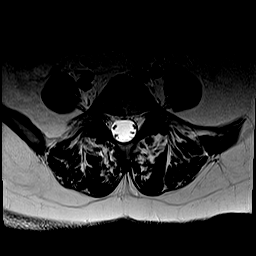
[im 11/36]
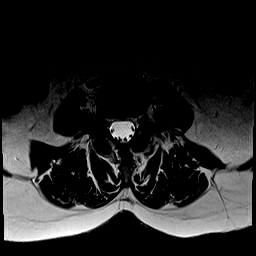
[im 17/36]
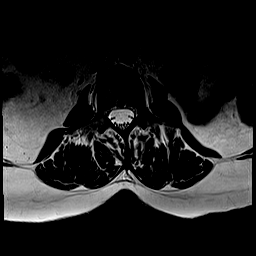
[im 19/36]
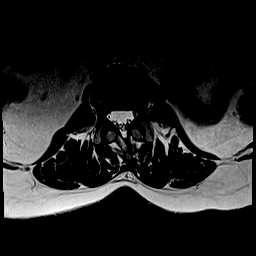
[im 25/36]
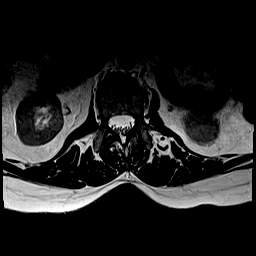
[im 30/36]
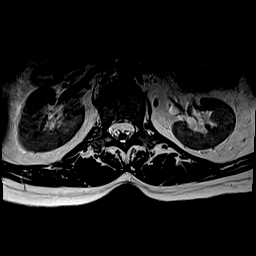
[im 36/36]
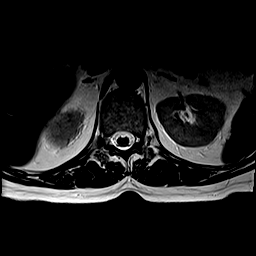

[Series 9: T1 · axial · 4.0mm · 0.39mm/px · z∈[-99,+128]mm · 8 of 36 slices shown (2 of 2)]
[im 1/36]
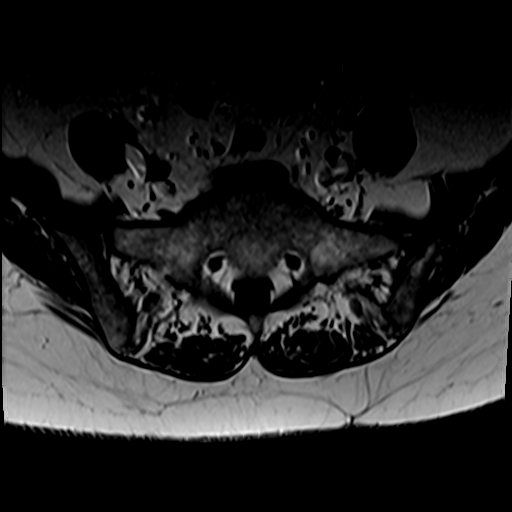
[im 6/36]
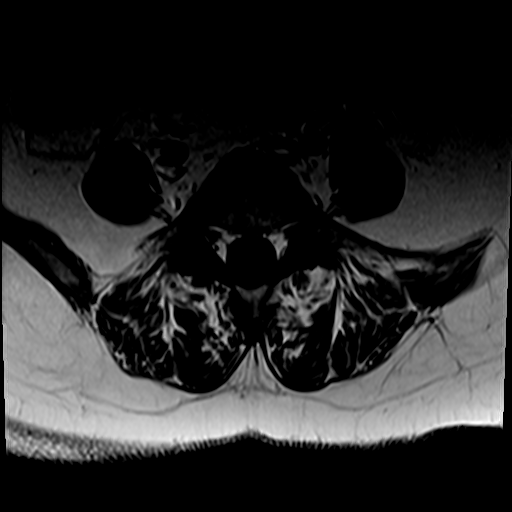
[im 11/36]
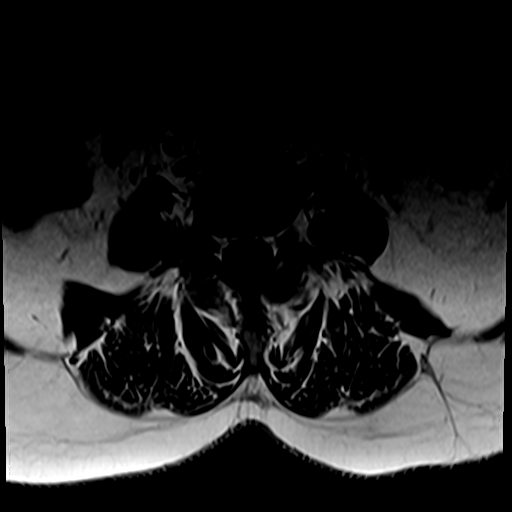
[im 17/36]
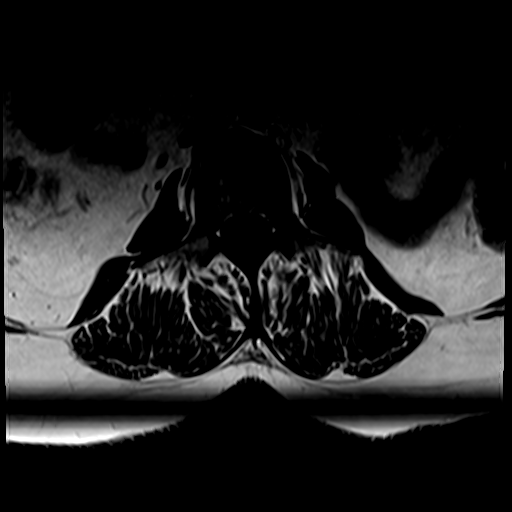
[im 19/36]
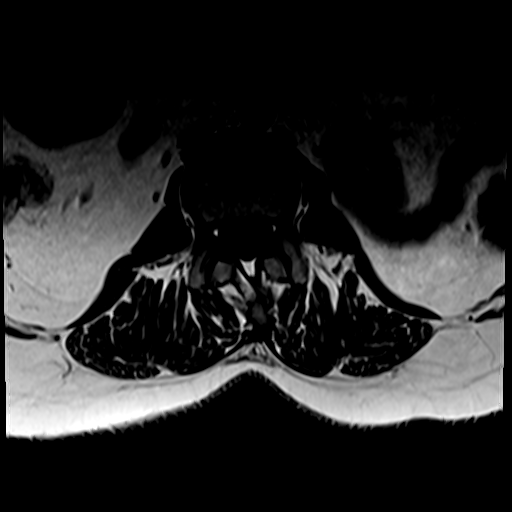
[im 25/36]
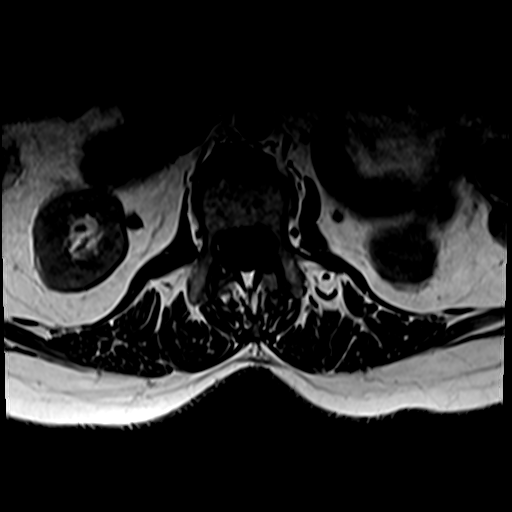
[im 30/36]
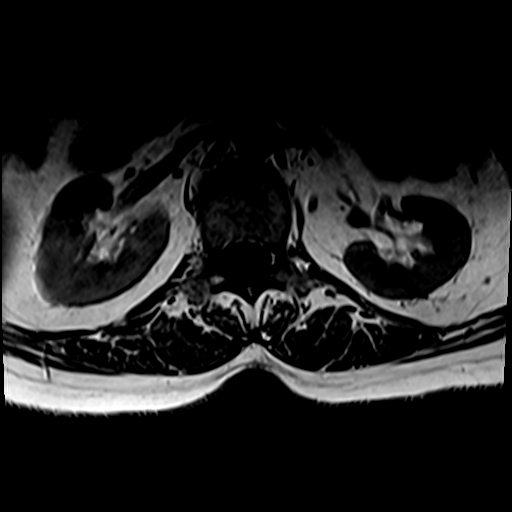
[im 36/36]
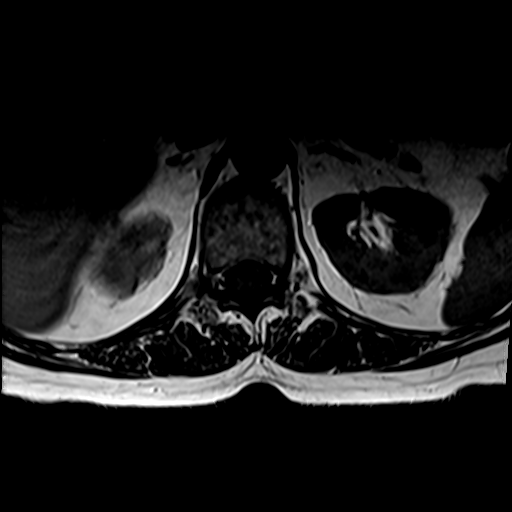

[31 of 48 positions shown; findings below may reference images not displayed]

FINDINGS: Segmentation:  Standard lumbar numbering

Alignment:  Negative for listhesis.  Borderline dextrocurvature.

Vertebrae:  No fracture, evidence of discitis, or bone lesion.

Conus medullaris and cauda equina: Conus extends to the L1-2 level.
Conus and cauda equina appear normal.

Paraspinal and other soft tissues: Negative

Disc levels:

Well preserved disc height and hydration. There is only mild
narrowing seen at L5-S1. Degenerative facet spurring that is
mild-to-moderate at L3-4 and below. No neural impingement.
IMPRESSION: 1. No impingement to explain left leg symptoms.
2. Facet osteoarthritis at L3-4 and below.

## 2022-08-24 ENCOUNTER — Other Ambulatory Visit: Payer: Self-pay | Admitting: Internal Medicine

## 2022-08-24 DIAGNOSIS — E034 Atrophy of thyroid (acquired): Secondary | ICD-10-CM

## 2022-08-24 NOTE — Telephone Encounter (Signed)
Requested Prescriptions  Pending Prescriptions Disp Refills  . levothyroxine (SYNTHROID) 50 MCG tablet [Pharmacy Med Name: LEVOTHYROXINE 0.05MG  (50MCG) TAB] 90 tablet 2    Sig: TAKE 1 TABLET(50 MCG) BY MOUTH DAILY BEFORE BREAKFAST     Endocrinology:  Hypothyroid Agents Failed - 08/24/2022  7:04 AM      Failed - TSH in normal range and within 360 days    TSH  Date Value Ref Range Status  02/03/2022 4.650 (H) 0.450 - 4.500 uIU/mL Final         Passed - Valid encounter within last 12 months    Recent Outpatient Visits          5 months ago Benign hypertension   [place] [facility] at [facility], Jesse Sans, MD   6 months ago Annual physical exam   [place] [facility] at [facility], Jesse Sans, MD   1 year ago Benign hypertension   [place] [facility] at [facility], Jesse Sans, MD   1 year ago Benign hypertension   [place] [facility] and [facility] at [facility], Jesse Sans, MD   1 year ago Annual physical exam   [facility] and [facility] Medicine at [facility], Jesse Sans, MD      Future Appointments            In 5 months Army Melia, Jesse Sans, MD [place] [facility] at [facility], [facility]

## 2022-09-28 ENCOUNTER — Other Ambulatory Visit: Payer: Self-pay | Admitting: Internal Medicine

## 2022-09-28 DIAGNOSIS — E781 Pure hyperglyceridemia: Secondary | ICD-10-CM

## 2022-09-28 NOTE — Telephone Encounter (Signed)
Requested Prescriptions  Pending Prescriptions Disp Refills   PARoxetine (PAXIL) 20 MG tablet [Pharmacy Med Name: PAROXETINE 20MG  TABLETS] 90 tablet 1    Sig: TAKE 1 TABLET(20 MG) BY MOUTH DAILY     Psychiatry:  Antidepressants - SSRI Failed - 09/28/2022  7:13 AM      Failed - Valid encounter within last 6 months    Recent Outpatient Visits           6 months ago Benign hypertension   [place] [facility] at [facility], [facility], MD   7 months ago Annual physical exam   [place] [facility] at [facility], [facility], MD   1 year ago Benign hypertension   [place] [facility] at [facility], [facility], MD   1 year ago Benign hypertension   [place] [facility] Care and [facility] Medicine at [facility], [facility], MD   1 year ago Annual physical exam   [place] [facility] and [facility] Medicine at [facility], [facility], MD       Future Appointments             In 4 months Nyoka Cowden Judithann Graves, MD [facility] Primary Care and Sports Medicine at [facility], [facility]             rosuvastatin (CRESTOR) 5 MG tablet [Pharmacy Med Name: ROSUVASTATIN 5MG  TABLETS] 90 tablet 1    Sig: TAKE 1 TABLET(5 MG) BY MOUTH DAILY     Cardiovascular:  Antilipid - Statins 2 Failed - 09/28/2022  7:13 AM      Failed - Lipid Panel in normal range within the last 12 months    Cholesterol, Total  Date Value Ref Range Status  02/03/2022 176 100 - 199 mg/dL Final   LDL Chol Calc (NIH)  Date Value Ref Range Status  02/03/2022 103 (H) 0 - 99 mg/dL Final   HDL  Date Value Ref Range Status  02/03/2022 36 (L) >39 mg/dL Final   Triglycerides  Date Value Ref Range Status  02/03/2022 212 (H) 0 - 149 mg/dL Final         Passed - Cr in normal range and within 360 days    Creatinine, Ser  Date Value Ref Range Status  02/03/2022 0.85 0.57 - 1.00 mg/dL Final          Passed - Patient is not pregnant      Passed - Valid encounter within last 12 months    Recent Outpatient Visits           6 months ago Benign hypertension   [place] [facility] at [facility], 04/05/2022, MD   7 months ago Annual physical exam   [place] [facility] at [facility], Nyoka Cowden, MD   1 year ago Benign hypertension   [place] [facility] and [facility] Medicine at [place], Nyoka Cowden, MD   1 year ago Benign hypertension    Primary Care and Sports Medicine at [facility], Nyoka Cowden, MD   1 year ago Annual physical exam   [facility] Primary [facility] and [facility] at [facility], [facility], MD       Future Appointments             In 4 months [facility] Nyoka Cowden, MD [facility]  Primary Care and Sports Medicine at [facility], [facility]

## 2022-10-29 ENCOUNTER — Other Ambulatory Visit: Payer: Self-pay | Admitting: Internal Medicine

## 2022-10-29 DIAGNOSIS — I10 Essential (primary) hypertension: Secondary | ICD-10-CM

## 2022-10-31 NOTE — Telephone Encounter (Signed)
Requested Prescriptions  Pending Prescriptions Disp Refills   losartan (COZAAR) 100 MG tablet [Pharmacy Med Name: LOSARTAN 100MG  TABLETS] 90 tablet 0    Sig: TAKE 1 TABLET(100 MG) BY MOUTH DAILY     Cardiovascular:  Angiotensin Receptor Blockers Failed - 10/29/2022  7:07 AM      Failed - Cr in normal range and within 180 days    Creatinine, Ser  Date Value Ref Range Status  02/03/2022 0.85 0.57 - 1.00 mg/dL Final         Failed - K in normal range and within 180 days    Potassium  Date Value Ref Range Status  02/03/2022 3.5 3.5 - 5.2 mmol/L Final         Failed - Valid encounter within last 6 months    Recent Outpatient Visits           7 months ago Benign hypertension   [place] [facility] Medicine at [facility], Jesse Sans, MD   9 months ago Annual physical exam   [place] [facility] Medicine at [facility], Jesse Sans, MD   1 year ago Benign hypertension   [place] [facility] and [facility] at [facility], Jesse Sans, MD   1 year ago Benign hypertension   [place] [facility] and Sports Medicine at [facility], Jesse Sans, MD   1 year ago Annual physical exam   [facility] at [facility], Jesse Sans, MD       Future Appointments             In 3 months Army Melia Jesse Sans, MD [facility] Primary Care and Sports Medicine at [facility], [place] - Patient is not pregnant      Passed - Last BP in normal range    BP Readings from Last 1 Encounters:  03/17/22 120/78

## 2022-11-30 ENCOUNTER — Other Ambulatory Visit: Payer: Self-pay | Admitting: Internal Medicine

## 2022-11-30 DIAGNOSIS — I10 Essential (primary) hypertension: Secondary | ICD-10-CM

## 2023-01-29 ENCOUNTER — Other Ambulatory Visit: Payer: Self-pay | Admitting: Internal Medicine

## 2023-01-29 DIAGNOSIS — I10 Essential (primary) hypertension: Secondary | ICD-10-CM

## 2023-01-29 NOTE — Telephone Encounter (Signed)
Requested Prescriptions  Pending Prescriptions Disp Refills   losartan (COZAAR) 100 MG tablet [Pharmacy Med Name: LOSARTAN 100MG  TABLETS] 90 tablet 0    Sig: TAKE 1 TABLET(100 MG) BY MOUTH DAILY     Cardiovascular:  Angiotensin Receptor Blockers Failed - 01/29/2023  6:59 AM      Failed - Cr in normal range and within 180 days    Creatinine, Ser  Date Value Ref Range Status  02/03/2022 0.85 0.57 - 1.00 mg/dL Final         Failed - K in normal range and within 180 days    Potassium  Date Value Ref Range Status  02/03/2022 3.5 3.5 - 5.2 mmol/L Final         Failed - Valid encounter within last 6 months    Recent Outpatient Visits           10 months ago Benign hypertension   [place] [facility] at [place], Jesse Sans, MD   12 months ago Annual physical exam   [facility] at [facility], Jesse Sans, MD   1 year ago Benign hypertension   [place] Primary [facility] Medicine at [place], Jesse Sans, MD   1 year ago Benign hypertension   [place] [facility] [place], Jesse Sans, MD   1 year ago Annual physical exam   [place] [facility], Jesse Sans, MD       Future Appointments             In 1 week Army Melia Jesse Sans, MD [place] at [facility], [place] - Patient is not pregnant      Passed - Last BP in normal range    BP Readings from Last 1 Encounters:  03/17/22 120/78

## 2023-02-09 ENCOUNTER — Ambulatory Visit
Admission: RE | Admit: 2023-02-09 | Discharge: 2023-02-09 | Disposition: A | Discharge status: Home / Self Care | Payer: Commercial Managed Care - PPO | Source: Ambulatory Visit | Attending: Internal Medicine | Admitting: Internal Medicine

## 2023-02-09 ENCOUNTER — Ambulatory Visit
Admission: RE | Admit: 2023-02-09 | Discharge: 2023-02-09 | Disposition: A | Discharge status: Home / Self Care | Payer: Commercial Managed Care - PPO | Attending: Internal Medicine | Admitting: Internal Medicine

## 2023-02-09 ENCOUNTER — Encounter: Payer: Self-pay | Admitting: Internal Medicine

## 2023-02-09 ENCOUNTER — Ambulatory Visit (INDEPENDENT_AMBULATORY_CARE_PROVIDER_SITE_OTHER): Payer: Commercial Managed Care - PPO | Admitting: Internal Medicine

## 2023-02-09 VITALS — BP 124/76 | HR 53 | Ht 62.0 in | Wt 139.0 lb

## 2023-02-09 DIAGNOSIS — G8929 Other chronic pain: Secondary | ICD-10-CM | POA: Insufficient documentation

## 2023-02-09 DIAGNOSIS — E034 Atrophy of thyroid (acquired): Secondary | ICD-10-CM

## 2023-02-09 DIAGNOSIS — I10 Essential (primary) hypertension: Secondary | ICD-10-CM | POA: Diagnosis not present

## 2023-02-09 DIAGNOSIS — I700 Atherosclerosis of aorta: Secondary | ICD-10-CM

## 2023-02-09 DIAGNOSIS — Z0000 Encounter for general adult medical examination without abnormal findings: Secondary | ICD-10-CM

## 2023-02-09 DIAGNOSIS — Z1231 Encounter for screening mammogram for malignant neoplasm of breast: Secondary | ICD-10-CM | POA: Diagnosis not present

## 2023-02-09 DIAGNOSIS — E782 Mixed hyperlipidemia: Secondary | ICD-10-CM

## 2023-02-09 DIAGNOSIS — M25521 Pain in right elbow: Secondary | ICD-10-CM

## 2023-02-09 DIAGNOSIS — K219 Gastro-esophageal reflux disease without esophagitis: Secondary | ICD-10-CM | POA: Diagnosis not present

## 2023-02-09 MED ORDER — HYDROCHLOROTHIAZIDE 25 MG PO TABS: 25.0000 mg | ORAL_TABLET | Freq: Every day | ORAL | 3 refills | Status: DC

## 2023-02-09 MED ORDER — ROSUVASTATIN CALCIUM 5 MG PO TABS: 5.0000 mg | ORAL_TABLET | Freq: Every day | ORAL | 3 refills | Status: DC

## 2023-02-09 MED ORDER — LEVOTHYROXINE SODIUM 50 MCG PO TABS: ORAL_TABLET | ORAL | 3 refills | Status: DC

## 2023-02-09 MED ORDER — OMEPRAZOLE 40 MG PO CPDR: DELAYED_RELEASE_CAPSULE | ORAL | 3 refills | Status: DC

## 2023-02-09 NOTE — Assessment & Plan Note (Signed)
Supplemented. Lab Results  Component Value Date   TSH 4.650 (H) 02/03/2022

## 2023-02-09 NOTE — Progress Notes (Signed)
Date:  02/09/2023   Name:  Darlene Newman   DOB:  1961/06/09   MRN:  [id number]   Chief Complaint: Annual Exam (Breast exam no pap ) Darlene Newman is a 62 y.o. female who presents today for her Complete Annual Exam. She feels well. She reports exercising none. She reports she is sleeping well. Breast complaints none.  Mammogram: 01/2020 DEXA: none Pap smear: d/c'd Colonoscopy: 2015  Health Maintenance Due  Topic Date Due   HIV Screening  Never done   Zoster Vaccines- Shingrix (1 of 2) Never done   MAMMOGRAM  02/18/2021    Immunization History  Administered Date(s) Administered   Influenza,inj,Quad PF,6+ Mos 08/03/2020   Influenza-Unspecified 07/05/2015, 08/13/2017   PFIZER(Purple Top)SARS-COV-2 Vaccination 09/04/2020, 09/30/2020   Tdap 02/03/2022    Hypertension This is a chronic problem. The problem is controlled. Pertinent negatives include no chest pain, headaches, palpitations or shortness of breath.  Gastroesophageal Reflux She complains of heartburn. She reports no abdominal pain, no chest pain, no coughing or no wheezing. This is a recurrent problem. The problem occurs rarely. Pertinent negatives include no fatigue. She has tried a PPI for the symptoms.  Hyperlipidemia This is a chronic problem. The problem is controlled. Pertinent negatives include no chest pain or shortness of breath. Current antihyperlipidemic treatment includes statins.    Lab Results  Component Value Date   NA 143 02/03/2022   K 3.5 02/03/2022   CO2 25 02/03/2022   GLUCOSE 95 02/03/2022   BUN 10 02/03/2022   CREATININE 0.85 02/03/2022   CALCIUM 9.0 02/03/2022   EGFR 78 02/03/2022   GFRNONAA 79 01/22/2020   Lab Results  Component Value Date   CHOL 176 02/03/2022   HDL 36 (L) 02/03/2022   LDLCALC 103 (H) 02/03/2022   TRIG 212 (H) 02/03/2022   CHOLHDL 4.9 (H) 02/03/2022   Lab Results  Component Value Date   TSH 4.650 (H) 02/03/2022   No results found for: "HGBA1C" Lab  Results  Component Value Date   WBC 5.6 02/03/2022   HGB 12.9 02/03/2022   HCT 36.6 02/03/2022   MCV 93 02/03/2022   PLT 251 02/03/2022   Lab Results  Component Value Date   ALT 22 02/03/2022   AST 25 02/03/2022   ALKPHOS 128 (H) 02/03/2022   BILITOT 0.6 02/03/2022   No results found for: "25OHVITD2", "25OHVITD3", "VD25OH"   Review of Systems  Constitutional:  Negative for chills, fatigue and fever.  HENT:  Negative for congestion, hearing loss, tinnitus, trouble swallowing and voice change.   Eyes:  Negative for visual disturbance.  Respiratory:  Negative for cough, chest tightness, shortness of breath and wheezing.   Cardiovascular:  Negative for chest pain, palpitations and leg swelling.  Gastrointestinal:  Positive for heartburn. Negative for abdominal pain, constipation, diarrhea and vomiting.  Endocrine: Negative for polydipsia and polyuria.  Genitourinary:  Negative for dysuria, frequency, genital sores, vaginal bleeding and vaginal discharge.  Musculoskeletal:  Positive for arthralgias (right elbow pain since January). Negative for gait problem and joint swelling.  Skin:  Negative for color change and rash.  Neurological:  Negative for dizziness, tremors, light-headedness and headaches.  Hematological:  Negative for adenopathy. Does not bruise/bleed easily.  Psychiatric/Behavioral:  Negative for dysphoric mood and sleep disturbance. The patient is not nervous/anxious.     Patient Active Problem List   Diagnosis Date Noted   H/O total vaginal hysterectomy 02/01/2022   Chronic left-sided low back pain with left-sided sciatica 08/03/2020  Atherosclerosis of aorta 01/22/2020   Degenerative cervical disc 06/12/2017   Hypothyroidism due to acquired atrophy of thyroid 11/24/2016   Impingement syndrome of right shoulder 05/20/2015   Mixed hyperlipidemia 02/17/2015   Benign hypertension 02/17/2015   Climacteric 02/17/2015   History of fundoplication 02/17/2015   Tobacco  use disorder, mild, in sustained remission 02/17/2015   Dermatitis contusiformis 05/31/2012   GERD without esophagitis 05/24/2012   Multiple lung nodules on CT 08/31/2011    Allergies  Allergen Reactions   Ibuprofen     Reduced renal fxn   Amlodipine Swelling    Past Surgical History:  Procedure Laterality Date   COLONOSCOPY  2015   COLONOSCOPY WITH ESOPHAGOGASTRODUODENOSCOPY (EGD)  2015   GASTRIC FUNDOPLICATION     HIATAL HERNIA REPAIR  2015   TOTAL VAGINAL HYSTERECTOMY  2012    Social History   Tobacco Use   Smoking status: Former    Packs/day: 1.50    Years: 20.00    Additional pack years: 0.00    Total pack years: 30.00    Types: Cigarettes    Quit date: 10/30/2006    Years since quitting: 16.2   Smokeless tobacco: Never  Vaping Use   Vaping Use: Never used  Substance Use Topics   Alcohol use: No    Alcohol/week: 0.0 standard drinks of alcohol   Drug use: No     Medication list has been reviewed and updated.  Current Meds  Medication Sig   dicyclomine (BENTYL) 10 MG capsule TAKE 1 CAPSULE(10 MG) BY MOUTH IN THE MORNING AND AT BEDTIME   losartan (COZAAR) 100 MG tablet TAKE 1 TABLET(100 MG) BY MOUTH DAILY   metoprolol tartrate (LOPRESSOR) 50 MG tablet TAKE 1 TABLET(50 MG) BY MOUTH TWICE DAILY   PARoxetine (PAXIL) 20 MG tablet TAKE 1 TABLET(20 MG) BY MOUTH DAILY   [DISCONTINUED] hydrochlorothiazide (HYDRODIURIL) 25 MG tablet Take 1 tablet (25 mg total) by mouth daily.   [DISCONTINUED] levothyroxine (SYNTHROID) 50 MCG tablet TAKE 1 TABLET(50 MCG) BY MOUTH DAILY BEFORE BREAKFAST   [DISCONTINUED] omeprazole (PRILOSEC) 40 MG capsule TAKE 1 CAPSULE(40 MG) BY MOUTH DAILY   [DISCONTINUED] rosuvastatin (CRESTOR) 5 MG tablet TAKE 1 TABLET(5 MG) BY MOUTH DAILY       02/09/2023    7:54 AM 03/17/2022    9:38 AM 05/04/2021    8:16 AM 03/03/2021    8:26 AM  GAD 7 : Generalized Anxiety Score  Nervous, Anxious, on Edge 0 0 0 0  Control/stop worrying 0 0 0 0  Worry too much  - different things 0 0 0 0  Trouble relaxing 0 0 0 0  Restless 0 0 0 0  Easily annoyed or irritable 1 0 0 0  Afraid - awful might happen 0 1 0 0  Total GAD 7 Score 1 1 0 0  Anxiety Difficulty Not difficult at all Not difficult at all Not difficult at all Not difficult at all       02/09/2023    7:54 AM 03/17/2022    9:38 AM 05/04/2021    8:15 AM  Depression screen PHQ 2/9  Decreased Interest 2 0 0  Down, Depressed, Hopeless 0 0 0  PHQ - 2 Score 2 0 0  Altered sleeping 0 2 0  Tired, decreased energy 0 0 0  Change in appetite 0 0 0  Feeling bad or failure about yourself  0 0 0  Trouble concentrating 0 0 0  Moving slowly or fidgety/restless 0 0 0  Suicidal thoughts 0 0 0  PHQ-9 Score 2 2 0  Difficult doing work/chores Not difficult at all Not difficult at all Not difficult at all    BP Readings from Last 3 Encounters:  02/09/23 124/76  03/17/22 120/78  02/26/22 114/77    Physical Exam Vitals and nursing note reviewed.  Constitutional:      General: She is not in acute distress.    Appearance: She is well-developed.  HENT:     Head: Normocephalic and atraumatic.     Right Ear: Tympanic membrane and ear canal normal.     Left Ear: Tympanic membrane and ear canal normal.     Nose:     Right Sinus: No maxillary sinus tenderness.     Left Sinus: No maxillary sinus tenderness.  Eyes:     General: No scleral icterus.       Right eye: No discharge.        Left eye: No discharge.     Conjunctiva/sclera: Conjunctivae normal.  Neck:     Thyroid: No thyromegaly.     Vascular: No carotid bruit.  Cardiovascular:     Rate and Rhythm: Normal rate and regular rhythm.     Pulses: Normal pulses.     Heart sounds: Normal heart sounds.  Pulmonary:     Effort: Pulmonary effort is normal. No respiratory distress.     Breath sounds: No wheezing.  Chest:  Breasts:    Right: No mass, nipple discharge, skin change or tenderness.     Left: No mass, nipple discharge, skin change or  tenderness.  Abdominal:     General: Bowel sounds are normal.     Palpations: Abdomen is soft.     Tenderness: There is no abdominal tenderness.  Musculoskeletal:     Right elbow: No swelling or deformity. Normal range of motion. Tenderness (at extremes of motion) present.     Cervical back: Normal range of motion. No erythema.     Right lower leg: No edema.     Left lower leg: No edema.  Lymphadenopathy:     Cervical: No cervical adenopathy.  Skin:    General: Skin is warm and dry.     Findings: No rash.  Neurological:     Mental Status: She is alert and oriented to person, place, and time.     Cranial Nerves: No cranial nerve deficit.     Sensory: No sensory deficit.     Deep Tendon Reflexes: Reflexes are normal and symmetric.  Psychiatric:        Attention and Perception: Attention normal.        Mood and Affect: Mood normal.     Wt Readings from Last 3 Encounters:  02/09/23 139 lb (63 kg)  03/17/22 141 lb (64 kg)  02/26/22 135 lb (61.2 kg)    BP 124/76   Pulse (!) 53   Ht  (1.575 m)   Wt 139 lb (63 kg)   SpO2 98%   BMI 25.42 kg/m   Assessment and Plan:  Problem List Items Addressed This Visit       Cardiovascular and Mediastinum   Atherosclerosis of aorta (Chronic)   Relevant Medications   hydrochlorothiazide (HYDRODIURIL) 25 MG tablet   rosuvastatin (CRESTOR) 5 MG tablet   Benign hypertension (Chronic)    Clinically stable exam with well controlled BP on metoprolol, losartan and hctz. Tolerating medications without side effects. Pt to continue current regimen and low sodium diet.  Relevant Medications   hydrochlorothiazide (HYDRODIURIL) 25 MG tablet   rosuvastatin (CRESTOR) 5 MG tablet   Other Relevant Orders   CBC with Differential/Platelet   Comprehensive metabolic panel     Digestive   GERD without esophagitis (Chronic)    Symptoms well controlled on daily PPI No red flag signs such as weight loss, n/v, melena       Relevant  Medications   omeprazole (PRILOSEC) 40 MG capsule   Other Relevant Orders   CBC with Differential/Platelet     Endocrine   Hypothyroidism due to acquired atrophy of thyroid (Chronic)    Supplemented. Lab Results  Component Value Date   TSH 4.650 (H) 02/03/2022        Relevant Medications   levothyroxine (SYNTHROID) 50 MCG tablet   Other Relevant Orders   TSH + free T4     Other   Mixed hyperlipidemia (Chronic)    Tolerating statin medications without concerns LDL is  Lab Results  Component Value Date   LDLCALC 103 (H) 02/03/2022  with a goal of < 70. Current dose will be adjusted if needed.       Relevant Medications   hydrochlorothiazide (HYDRODIURIL) 25 MG tablet   rosuvastatin (CRESTOR) 5 MG tablet   Other Relevant Orders   Lipid panel   Other Visit Diagnoses     Annual physical exam    -  Primary   Relevant Orders   CBC with Differential/Platelet   Comprehensive metabolic panel   Lipid panel   TSH + free T4   Encounter for screening mammogram for breast cancer       Relevant Orders   MM 3D SCREENING MAMMOGRAM BILATERAL BREAST   Elbow pain, chronic, right       s/p blunt trauma in January while at work continues to be painful at times with no obvious deformity etc rule out avulsion fracture   Relevant Orders   DG Elbow Complete Right       Return in about 6 months (around 08/11/2023) for HTN.   Partially dictated using Dragon software, any errors are not intentional.  Reubin Milan, MD [facility] Primary Care and Sports Medicine [place], [place]

## 2023-02-09 NOTE — Assessment & Plan Note (Signed)
Tolerating statin medications without concerns LDL is  Lab Results  Component Value Date   LDLCALC 103 (H) 02/03/2022   with a goal of < 70. Current dose will be adjusted if needed.  

## 2023-02-09 NOTE — Assessment & Plan Note (Signed)
Clinically stable exam with well controlled BP on metoprolol, losartan and hctz. Tolerating medications without side effects. Pt to continue current regimen and low sodium diet.

## 2023-02-09 NOTE — Assessment & Plan Note (Signed)
Symptoms well controlled on daily PPI No red flag signs such as weight loss, n/v, melena  

## 2023-02-09 NOTE — Patient Instructions (Signed)
Call [facility] to schedule your mammogram at [phone].  

## 2023-02-10 LAB — CBC WITH DIFFERENTIAL/PLATELET
Basophils Absolute: 0 10*3/uL (ref 0.0–0.2)
Basos: 1 %
EOS (ABSOLUTE): 0.1 10*3/uL (ref 0.0–0.4)
Eos: 2 %
Hematocrit: 33.7 % — ABNORMAL LOW (ref 34.0–46.6)
Hemoglobin: 11.3 g/dL (ref 11.1–15.9)
Immature Grans (Abs): 0 10*3/uL (ref 0.0–0.1)
Immature Granulocytes: 0 %
Lymphocytes Absolute: 1.6 10*3/uL (ref 0.7–3.1)
Lymphs: 27 %
MCH: 32.8 pg (ref 26.6–33.0)
MCHC: 33.5 g/dL (ref 31.5–35.7)
MCV: 98 fL — ABNORMAL HIGH (ref 79–97)
Monocytes Absolute: 0.6 10*3/uL (ref 0.1–0.9)
Monocytes: 9 %
Neutrophils Absolute: 3.8 10*3/uL (ref 1.4–7.0)
Neutrophils: 61 %
Platelets: 199 10*3/uL (ref 150–450)
RBC: 3.45 x10E6/uL — ABNORMAL LOW (ref 3.77–5.28)
RDW: 14.1 % (ref 11.7–15.4)
WBC: 6.1 10*3/uL (ref 3.4–10.8)

## 2023-02-10 LAB — COMPREHENSIVE METABOLIC PANEL
ALT: 23 IU/L (ref 0–32)
AST: 22 IU/L (ref 0–40)
Albumin/Globulin Ratio: 1.9 (ref 1.2–2.2)
Albumin: 4.4 g/dL (ref 3.9–4.9)
Alkaline Phosphatase: 108 IU/L (ref 44–121)
BUN/Creatinine Ratio: 24 (ref 12–28)
BUN: 22 mg/dL (ref 8–27)
Bilirubin Total: 0.5 mg/dL (ref 0.0–1.2)
CO2: 27 mmol/L (ref 20–29)
Calcium: 9.7 mg/dL (ref 8.7–10.3)
Chloride: 98 mmol/L (ref 96–106)
Creatinine, Ser: 0.9 mg/dL (ref 0.57–1.00)
Globulin, Total: 2.3 g/dL (ref 1.5–4.5)
Glucose: 102 mg/dL — ABNORMAL HIGH (ref 70–99)
Potassium: 2.9 mmol/L — ABNORMAL LOW (ref 3.5–5.2)
Sodium: 141 mmol/L (ref 134–144)
Total Protein: 6.7 g/dL (ref 6.0–8.5)
eGFR: 73 mL/min/{1.73_m2} (ref >59)

## 2023-02-10 LAB — LIPID PANEL
Chol/HDL Ratio: 4.8 ratio — ABNORMAL HIGH (ref 0.0–4.4)
Cholesterol, Total: 172 mg/dL (ref 100–199)
HDL: 36 mg/dL — ABNORMAL LOW (ref >39)
LDL Chol Calc (NIH): 97 mg/dL (ref 0–99)
Triglycerides: 228 mg/dL — ABNORMAL HIGH (ref 0–149)
VLDL Cholesterol Cal: 39 mg/dL (ref 5–40)

## 2023-02-10 LAB — TSH+FREE T4
Free T4: 1.27 ng/dL (ref 0.82–1.77)
TSH: 5.69 u[IU]/mL — ABNORMAL HIGH (ref 0.450–4.500)

## 2023-02-11 ENCOUNTER — Other Ambulatory Visit: Payer: Self-pay | Admitting: Internal Medicine

## 2023-02-11 DIAGNOSIS — E876 Hypokalemia: Secondary | ICD-10-CM

## 2023-02-11 MED ORDER — POTASSIUM CHLORIDE CRYS ER 10 MEQ PO TBCR
10.0000 meq | EXTENDED_RELEASE_TABLET | Freq: Two times a day (BID) | ORAL | 0 refills | Status: DC
Start: 2023-02-11 — End: 2023-08-23

## 2023-03-18 ENCOUNTER — Other Ambulatory Visit: Payer: Self-pay | Admitting: Internal Medicine

## 2023-03-18 DIAGNOSIS — I10 Essential (primary) hypertension: Secondary | ICD-10-CM

## 2023-03-30 ENCOUNTER — Ambulatory Visit
Admission: RE | Admit: 2023-03-30 | Discharge: 2023-03-30 | Disposition: A | Discharge status: Home / Self Care | Payer: Commercial Managed Care - PPO | Source: Ambulatory Visit | Attending: Internal Medicine | Admitting: Internal Medicine

## 2023-03-30 ENCOUNTER — Other Ambulatory Visit: Payer: Self-pay | Admitting: Internal Medicine

## 2023-03-30 DIAGNOSIS — Z1231 Encounter for screening mammogram for malignant neoplasm of breast: Secondary | ICD-10-CM | POA: Diagnosis present

## 2023-04-25 ENCOUNTER — Other Ambulatory Visit: Payer: Self-pay | Admitting: Internal Medicine

## 2023-04-25 DIAGNOSIS — I10 Essential (primary) hypertension: Secondary | ICD-10-CM

## 2023-04-25 DIAGNOSIS — K219 Gastro-esophageal reflux disease without esophagitis: Secondary | ICD-10-CM

## 2023-05-08 ENCOUNTER — Other Ambulatory Visit: Payer: Self-pay | Admitting: Internal Medicine

## 2023-05-08 DIAGNOSIS — K219 Gastro-esophageal reflux disease without esophagitis: Secondary | ICD-10-CM

## 2023-06-04 ENCOUNTER — Other Ambulatory Visit: Payer: Self-pay | Admitting: Internal Medicine

## 2023-06-04 DIAGNOSIS — I10 Essential (primary) hypertension: Secondary | ICD-10-CM

## 2023-07-15 ENCOUNTER — Other Ambulatory Visit: Payer: Self-pay | Admitting: Internal Medicine

## 2023-07-15 DIAGNOSIS — E782 Mixed hyperlipidemia: Secondary | ICD-10-CM

## 2023-07-15 DIAGNOSIS — I10 Essential (primary) hypertension: Secondary | ICD-10-CM

## 2023-07-31 ENCOUNTER — Other Ambulatory Visit: Payer: Self-pay | Admitting: Internal Medicine

## 2023-07-31 DIAGNOSIS — E034 Atrophy of thyroid (acquired): Secondary | ICD-10-CM

## 2023-08-01 NOTE — Telephone Encounter (Signed)
Requested Prescriptions  Pending Prescriptions Disp Refills   levothyroxine (SYNTHROID) 50 MCG tablet [Pharmacy Med Name: LEVOTHYROXINE 0.05MG  ( ) TAB] 90 tablet 0    Sig: TAKE 1 TABLET(50 MCG) BY MOUTH DAILY BEFORE BREAKFAST     Endocrinology:  Hypothyroid Agents Failed - 07/31/2023  3:36 PM      Failed - TSH in normal range and within 360 days    TSH  Date Value Ref Range Status  02/09/2023 5.690 (H) 0.450 - 4.500 uIU/mL Final         Passed - Valid encounter within last 12 months    Recent Outpatient Visits           5 months ago Annual physical exam   [place] [facility] at [facility], Nyoka Cowden, MD   1 year ago Benign hypertension   [place] [facility] at [facility], Nyoka Cowden, MD   1 year ago Annual physical exam   [facility] Primary Care & Sports Medicine at [facility], Nyoka Cowden, MD   2 years ago Benign hypertension   [place] [facility] at [facility], Nyoka Cowden, MD   2 years ago Benign hypertension   [place] [facility] at [facility], Nyoka Cowden, MD       Future Appointments             In 3 weeks Judithann Graves Nyoka Cowden, MD [facility] Primary Care & Sports Medicine at [facility], [facility]

## 2023-08-22 ENCOUNTER — Encounter: Payer: Self-pay | Admitting: Internal Medicine

## 2023-08-22 ENCOUNTER — Ambulatory Visit: Payer: Commercial Managed Care - PPO | Admitting: Internal Medicine

## 2023-08-22 VITALS — BP 120/70 | HR 74 | Ht 62.0 in | Wt 137.0 lb

## 2023-08-22 DIAGNOSIS — Z23 Encounter for immunization: Secondary | ICD-10-CM

## 2023-08-22 DIAGNOSIS — I10 Essential (primary) hypertension: Secondary | ICD-10-CM

## 2023-08-22 DIAGNOSIS — M720 Palmar fascial fibromatosis [Dupuytren]: Secondary | ICD-10-CM

## 2023-08-22 NOTE — Progress Notes (Signed)
Date:  08/22/2023   Name:  Darlene Newman   DOB:  29-Dec-1960   MRN:  [id number]   Chief Complaint: Hypertension  Hypertension This is a chronic problem. The problem is controlled. Pertinent negatives include no chest pain, headaches, palpitations or shortness of breath. Past treatments include angiotensin blockers, beta blockers and diuretics. The current treatment provides significant improvement.  Hand Pain  There was no injury mechanism. The pain radiates to the right hand. The patient is experiencing no pain (thickening of the palmar fascia). Pertinent negatives include no chest pain.    Review of Systems  Constitutional:  Negative for fatigue and unexpected weight change.  HENT:  Negative for nosebleeds.   Eyes:  Negative for visual disturbance.  Respiratory:  Negative for cough, chest tightness, shortness of breath and wheezing.   Cardiovascular:  Negative for chest pain, palpitations and leg swelling.  Gastrointestinal:  Negative for abdominal pain, constipation and diarrhea.  Musculoskeletal:  Negative for joint swelling and myalgias.  Neurological:  Negative for dizziness, weakness, light-headedness and headaches.  Psychiatric/Behavioral:  Negative for dysphoric mood and sleep disturbance. The patient is not nervous/anxious.      Lab Results  Component Value Date   NA 141 02/09/2023   K 2.9 (L) 02/09/2023   CO2 27 02/09/2023   GLUCOSE 102 (H) 02/09/2023   BUN 22 02/09/2023   CREATININE 0.90 02/09/2023   CALCIUM 9.7 02/09/2023   EGFR 73 02/09/2023   GFRNONAA 79 01/22/2020   Lab Results  Component Value Date   CHOL 172 02/09/2023   HDL 36 (L) 02/09/2023   LDLCALC 97 02/09/2023   TRIG 228 (H) 02/09/2023   CHOLHDL 4.8 (H) 02/09/2023   Lab Results  Component Value Date   TSH 5.690 (H) 02/09/2023   No results found for: "HGBA1C" Lab Results  Component Value Date   WBC 6.1 02/09/2023   HGB 11.3 02/09/2023   HCT 33.7 (L) 02/09/2023   MCV 98 (H) 02/09/2023    PLT 199 02/09/2023   Lab Results  Component Value Date   ALT 23 02/09/2023   AST 22 02/09/2023   ALKPHOS 108 02/09/2023   BILITOT 0.5 02/09/2023   No results found for: "25OHVITD2", "25OHVITD3", "VD25OH"   Patient Active Problem List   Diagnosis Date Noted   Palmar fascial fibromatosis (dupuytren) 08/22/2023   H/O total vaginal hysterectomy 02/01/2022   Chronic left-sided low back pain with left-sided sciatica 08/03/2020   Atherosclerosis of aorta (HCC) 01/22/2020   Degenerative cervical disc 06/12/2017   Hypothyroidism due to acquired atrophy of thyroid 11/24/2016   Impingement syndrome of right shoulder 05/20/2015   Mixed hyperlipidemia 02/17/2015   Benign hypertension 02/17/2015   Climacteric 02/17/2015   History of fundoplication 02/17/2015   Tobacco use disorder, mild, in sustained remission 02/17/2015   Dermatitis contusiformis 05/31/2012   GERD without esophagitis 05/24/2012   Multiple lung nodules on CT 08/31/2011    Allergies  Allergen Reactions   Ibuprofen     Reduced renal fxn   Amlodipine Swelling    Past Surgical History:  Procedure Laterality Date   COLONOSCOPY  2015   COLONOSCOPY WITH ESOPHAGOGASTRODUODENOSCOPY (EGD)  2015   GASTRIC FUNDOPLICATION     HIATAL HERNIA REPAIR  2015   TOTAL VAGINAL HYSTERECTOMY  2012    Social History   Tobacco Use   Smoking status: Former    Current packs/day: 0.00    Average packs/day: 1.5 packs/day for 20.0 years (30.0 ttl pk-yrs)    Types:  Cigarettes    Start date: 10/30/1986    Quit date: 10/30/2006    Years since quitting: 16.8   Smokeless tobacco: Never  Vaping Use   Vaping status: Never Used  Substance Use Topics   Alcohol use: No    Alcohol/week: 0.0 standard drinks of alcohol   Drug use: No     Medication list has been reviewed and updated.  Current Meds  Medication Sig   dicyclomine (BENTYL) 10 MG capsule TAKE 1 CAPSULE(10 MG) BY MOUTH IN THE MORNING AND AT BEDTIME   hydrochlorothiazide  (HYDRODIURIL) 25 MG tablet TAKE 1 TABLET(25 MG) BY MOUTH DAILY   levothyroxine (SYNTHROID) 50 MCG tablet TAKE 1 TABLET(50 MCG) BY MOUTH DAILY BEFORE BREAKFAST   losartan (COZAAR) 100 MG tablet TAKE 1 TABLET(100 MG) BY MOUTH DAILY   metoprolol tartrate (LOPRESSOR) 50 MG tablet TAKE 1 TABLET(50 MG) BY MOUTH TWICE DAILY   omeprazole (PRILOSEC) 40 MG capsule TAKE 1 CAPSULE(40 MG) BY MOUTH DAILY   PARoxetine (PAXIL) 20 MG tablet TAKE 1 TABLET(20 MG) BY MOUTH DAILY   rosuvastatin (CRESTOR) 5 MG tablet TAKE 1 TABLET(5 MG) BY MOUTH DAILY       08/22/2023    7:58 AM 02/09/2023    7:54 AM 03/17/2022    9:38 AM 05/04/2021    8:16 AM  GAD 7 : Generalized Anxiety Score  Nervous, Anxious, on Edge 0 0 0 0  Control/stop worrying 0 0 0 0  Worry too much - different things 0 0 0 0  Trouble relaxing 0 0 0 0  Restless 0 0 0 0  Easily annoyed or irritable 0 1 0 0  Afraid - awful might happen 0 0 1 0  Total GAD 7 Score 0 1 1 0  Anxiety Difficulty Not difficult at all Not difficult at all Not difficult at all Not difficult at all       08/22/2023    7:58 AM 02/09/2023    7:54 AM 03/17/2022    9:38 AM  Depression screen PHQ 2/9  Decreased Interest 1 2 0  Down, Depressed, Hopeless 0 0 0  PHQ - 2 Score 1 2 0  Altered sleeping 1 0 2  Tired, decreased energy 1 0 0  Change in appetite 0 0 0  Feeling bad or failure about yourself  0 0 0  Trouble concentrating 0 0 0  Moving slowly or fidgety/restless 0 0 0  Suicidal thoughts 0 0 0  PHQ-9 Score 3 2 2   Difficult doing work/chores Not difficult at all Not difficult at all Not difficult at all    BP Readings from Last 3 Encounters:  08/22/23 120/70  02/09/23 124/76  03/17/22 120/78    Physical Exam Vitals and nursing note reviewed.  Constitutional:      General: She is not in acute distress.    Appearance: She is well-developed.  HENT:     Head: Normocephalic and atraumatic.  Cardiovascular:     Rate and Rhythm: Normal rate and regular rhythm.      Pulses: Normal pulses.  Pulmonary:     Effort: Pulmonary effort is normal. No respiratory distress.     Breath sounds: No wheezing or rhonchi.  Musculoskeletal:        General: Normal range of motion.     Right hand: Deformity (thickening of fascia of the palm - no triggering or pain) present.     Right lower leg: No edema.     Left lower leg: No edema.  Skin:    General: Skin is warm and dry.     Findings: No rash.  Neurological:     Mental Status: She is alert and oriented to person, place, and time.  Psychiatric:        Mood and Affect: Mood normal.        Behavior: Behavior normal.     Wt Readings from Last 3 Encounters:  08/22/23 137 lb (62.1 kg)  02/09/23 139 lb (63 kg)  03/17/22 141 lb (64 kg)    BP 120/70 (BP Location: Right Arm, Cuff Size: Normal)   Pulse 74   Ht 5\' 2"  (1.575 m)   Wt 137 lb (62.1 kg)   SpO2 98%   BMI 25.06 kg/m   Assessment and Plan:  Problem List Items Addressed This Visit       Unprioritized   Benign hypertension - Primary (Chronic)    Normal exam with stable BP on losartan and metoprolol and hctz. Potassium was low last check - supplement recommended with recheck but she did not follow through. No concerns or side effects to current medication. No change in regimen; continue low sodium diet.       Relevant Orders   Basic metabolic panel   Palmar fascial fibromatosis (dupuytren)    Recommend Ortho referral as soon as pain or limitation of motion occurs      Other Visit Diagnoses     Need for influenza vaccination       Relevant Orders   Flu vaccine trivalent PF, 6mos and older(Flulaval,Afluria,Fluarix,Fluzone) (Completed)       Return in about 6 months (around 02/20/2024) for CPX.    Reubin Milan, MD [facility] [place]

## 2023-08-22 NOTE — Assessment & Plan Note (Addendum)
Normal exam with stable BP on losartan and metoprolol and hctz. Potassium was low last check - supplement recommended with recheck but she did not follow through. No concerns or side effects to current medication. No change in regimen; continue low sodium diet.

## 2023-08-22 NOTE — Assessment & Plan Note (Signed)
Recommend Ortho referral as soon as pain or limitation of motion occurs

## 2023-08-23 ENCOUNTER — Other Ambulatory Visit: Payer: Self-pay | Admitting: Internal Medicine

## 2023-08-23 DIAGNOSIS — E876 Hypokalemia: Secondary | ICD-10-CM

## 2023-08-23 LAB — BASIC METABOLIC PANEL
BUN/Creatinine Ratio: 28 (ref 12–28)
BUN: 28 mg/dL — ABNORMAL HIGH (ref 8–27)
CO2: 28 mmol/L (ref 20–29)
Calcium: 9.7 mg/dL (ref 8.7–10.3)
Chloride: 99 mmol/L (ref 96–106)
Creatinine, Ser: 0.99 mg/dL (ref 0.57–1.00)
Glucose: 116 mg/dL — ABNORMAL HIGH (ref 70–99)
Potassium: 2.9 mmol/L — ABNORMAL LOW (ref 3.5–5.2)
Sodium: 142 mmol/L (ref 134–144)
eGFR: 65 mL/min/{1.73_m2} (ref >59)

## 2023-08-23 MED ORDER — POTASSIUM CHLORIDE CRYS ER 10 MEQ PO TBCR
10.0000 meq | EXTENDED_RELEASE_TABLET | Freq: Every day | ORAL | 0 refills | Status: DC
Start: 2023-08-23 — End: 2023-12-11

## 2023-10-02 ENCOUNTER — Other Ambulatory Visit: Payer: Self-pay | Admitting: Internal Medicine

## 2023-10-04 NOTE — Telephone Encounter (Signed)
Requested Prescriptions  Pending Prescriptions Disp Refills   PARoxetine (PAXIL) 20 MG tablet [Pharmacy Med Name: PAROXETINE 20MG  TABLETS] 90 tablet 1    Sig: TAKE 1 TABLET(20 MG) BY MOUTH DAILY     Psychiatry:  Antidepressants - SSRI Passed - 10/02/2023  6:59 AM      Passed - Valid encounter within last 6 months    Recent Outpatient Visits           1 month ago Benign hypertension   [place] [facility] at [facility], Nyoka Cowden, MD   7 months ago Annual physical exam   [facility] at [facility], Nyoka Cowden, MD   1 year ago Benign hypertension   [place] [facility] at [facility], Nyoka Cowden, MD   1 year ago Annual physical exam   [facility] at [facility], Nyoka Cowden, MD   2 years ago Benign hypertension   [place] [facility] at [facility], Nyoka Cowden, MD       Future Appointments             In 4 months Judithann Graves, Nyoka Cowden, MD [facility] at [facility], [facility]

## 2023-10-11 ENCOUNTER — Ambulatory Visit: Payer: Self-pay

## 2023-10-11 ENCOUNTER — Telehealth: Payer: Commercial Managed Care - PPO | Admitting: Physician Assistant

## 2023-10-11 ENCOUNTER — Encounter: Payer: Self-pay | Admitting: Physician Assistant

## 2023-10-11 VITALS — Ht 62.0 in

## 2023-10-11 DIAGNOSIS — J069 Acute upper respiratory infection, unspecified: Secondary | ICD-10-CM

## 2023-10-11 MED ORDER — GUAIFENESIN-CODEINE 100-10 MG/5ML PO SOLN
5.0000 mL | Freq: Three times a day (TID) | ORAL | 0 refills | Status: DC | PRN
Start: 2023-10-11 — End: 2024-01-29

## 2023-10-11 MED ORDER — BENZONATATE 100 MG PO CAPS
100.0000 mg | ORAL_CAPSULE | Freq: Three times a day (TID) | ORAL | 0 refills | Status: AC
Start: 2023-10-11 — End: 2023-10-21

## 2023-10-11 NOTE — Progress Notes (Signed)
Date:  10/11/2023   Name:  Darlene Newman   DOB:  08-01-1961   MRN:  [id number]   I connected with Darlene Newman on 10/11/23 via MyChart Video and verified that I am speaking with the correct person using appropriate identifiers. The limitations, risks, security and privacy concerns of performing an evaluation and management service by MyChart Video, including the higher likelihood of inaccurate diagnoses and treatments, and the availability of in person appointments were reviewed. The possible need of an additional face-to-face encounter for complete and high quality delivery of care was discussed. The patient was also made aware that there may be a patient responsible charge related to this service. The patient expressed understanding and wishes to proceed.   Provider location is in medical facility [facility] Medicine at [facility]). Patient location is at their home People involved in care of the patient during this telehealth encounter were myself, my CMA, and my front office/scheduling team member.    Chief Complaint: URI  URI    Darlene Newman is a 62 y.o. female new to me today who presents virtually for evaluation of URI symptoms as noted above for the last 2 days, mostly a cough which is disruptive to sleep. No sick contacts. Former smoker quit 2008, last CT was 2021 with small nodules and scarring. No formally diagnosed COPD/asthma. Denies fever or dyspnea at rest.    Medication list has been reviewed and updated.  Current Meds  Medication Sig   benzonatate (TESSALON) 100 MG capsule Take 1-2 capsules (100-200 mg total) by mouth 3 (three) times daily for 10 days.   dicyclomine (BENTYL) 10 MG capsule TAKE 1 CAPSULE(10 MG) BY MOUTH IN THE MORNING AND AT BEDTIME   guaiFENesin-codeine 100-10 MG/5ML syrup Take 5 mLs by mouth 3 (three) times daily as needed for cough. May cause drowsiness   hydrochlorothiazide (HYDRODIURIL) 25 MG tablet TAKE 1 TABLET(25 MG)  BY MOUTH DAILY   levothyroxine (SYNTHROID) 50 MCG tablet TAKE 1 TABLET(50 MCG) BY MOUTH DAILY BEFORE BREAKFAST   losartan (COZAAR) 100 MG tablet TAKE 1 TABLET(100 MG) BY MOUTH DAILY   metoprolol tartrate (LOPRESSOR) 50 MG tablet TAKE 1 TABLET(50 MG) BY MOUTH TWICE DAILY   omeprazole (PRILOSEC) 40 MG capsule TAKE 1 CAPSULE(40 MG) BY MOUTH DAILY   PARoxetine (PAXIL) 20 MG tablet TAKE 1 TABLET(20 MG) BY MOUTH DAILY   potassium chloride (KLOR-CON M) 10 MEQ tablet Take 1 tablet (10 mEq total) by mouth daily.   rosuvastatin (CRESTOR) 5 MG tablet TAKE 1 TABLET(5 MG) BY MOUTH DAILY     Review of Systems  Patient Active Problem List   Diagnosis Date Noted   Palmar fascial fibromatosis (dupuytren) 08/22/2023   H/O total vaginal hysterectomy 02/01/2022   Chronic left-sided low back pain with left-sided sciatica 08/03/2020   Atherosclerosis of aorta (HCC) 01/22/2020   Degenerative cervical disc 06/12/2017   Hypothyroidism due to acquired atrophy of thyroid 11/24/2016   Impingement syndrome of right shoulder 05/20/2015   Mixed hyperlipidemia 02/17/2015   Benign hypertension 02/17/2015   Climacteric 02/17/2015   History of fundoplication 02/17/2015   Tobacco use disorder, mild, in sustained remission 02/17/2015   Dermatitis contusiformis 05/31/2012   GERD without esophagitis 05/24/2012   Multiple lung nodules on CT 08/31/2011    Allergies  Allergen Reactions   Ibuprofen     Reduced renal fxn   Amlodipine Swelling    Immunization History  Administered Date(s) Administered   Influenza, Seasonal, Injecte, Preservative  Fre 08/22/2023   Influenza,inj,Quad PF,6+ Mos 08/03/2020   Influenza-Unspecified 07/05/2015, 08/13/2017   PFIZER(Purple Top)SARS-COV-2 Vaccination 09/04/2020, 09/30/2020   Tdap 02/03/2022    Past Surgical History:  Procedure Laterality Date   COLONOSCOPY  2015   COLONOSCOPY WITH ESOPHAGOGASTRODUODENOSCOPY (EGD)  2015   GASTRIC FUNDOPLICATION     HIATAL HERNIA REPAIR   2015   TOTAL VAGINAL HYSTERECTOMY  2012    Social History   Tobacco Use   Smoking status: Former    Current packs/day: 0.00    Average packs/day: 1.5 packs/day for 20.0 years (30.0 ttl pk-yrs)    Types: Cigarettes    Start date: 10/30/1986    Quit date: 10/30/2006    Years since quitting: 16.9   Smokeless tobacco: Never  Vaping Use   Vaping status: Never Used  Substance Use Topics   Alcohol use: No    Alcohol/week: 0.0 standard drinks of alcohol   Drug use: No    Family History  Problem Relation Age of Onset   Diabetes Mother    Heart disease Mother    Diabetes Sister    Breast cancer Neg Hx         10/11/2023    1:26 PM 08/22/2023    7:58 AM 02/09/2023    7:54 AM 03/17/2022    9:38 AM  GAD 7 : Generalized Anxiety Score  Nervous, Anxious, on Edge 0 0 0 0  Control/stop worrying 0 0 0 0  Worry too much - different things 0 0 0 0  Trouble relaxing 0 0 0 0  Restless 0 0 0 0  Easily annoyed or irritable 0 0 1 0  Afraid - awful might happen 0 0 0 1  Total GAD 7 Score 0 0 1 1  Anxiety Difficulty Not difficult at all Not difficult at all Not difficult at all Not difficult at all       10/11/2023    1:26 PM 08/22/2023    7:58 AM 02/09/2023    7:54 AM  Depression screen PHQ 2/9  Decreased Interest 0 1 2  Down, Depressed, Hopeless 0 0 0  PHQ - 2 Score 0 1 2  Altered sleeping 0 1 0  Tired, decreased energy 0 1 0  Change in appetite 0 0 0  Feeling bad or failure about yourself  0 0 0  Trouble concentrating 0 0 0  Moving slowly or fidgety/restless 0 0 0  Suicidal thoughts 0 0 0  PHQ-9 Score 0 3 2  Difficult doing work/chores Not difficult at all Not difficult at all Not difficult at all    BP Readings from Last 3 Encounters:  08/22/23 120/70  02/09/23 124/76  03/17/22 120/78    Wt Readings from Last 3 Encounters:  08/22/23 137 lb (62.1 kg)  02/09/23 139 lb (63 kg)  03/17/22 141 lb (64 kg)    Ht 5\' 2"  (1.575 m)   BMI 25.06 kg/m   Physical Exam General:  Speaking full sentences, no audible heavy breathing, though her speech is at times interrupted by a harsh cough. Sounds alert and appropriately interactive. Well-appearing. Face symmetric. Extraocular movements intact. Pupils equal and round. No nasal flaring or accessory muscle use visualized.  Recent Labs     Component Value Date/Time   NA 142 08/22/2023 0827   K 2.9 (L) 08/22/2023 0827   CL 99 08/22/2023 0827   CO2 28 08/22/2023 0827   GLUCOSE 116 (H) 08/22/2023 0827   GLUCOSE 106 (H) 11/24/2016 0630  BUN 28 (H) 08/22/2023 0827   CREATININE 0.99 08/22/2023 0827   CALCIUM 9.7 08/22/2023 0827   PROT 6.7 02/09/2023 0856   ALBUMIN 4.4 02/09/2023 0856   AST 22 02/09/2023 0856   ALT 23 02/09/2023 0856   ALKPHOS 108 02/09/2023 0856   BILITOT 0.5 02/09/2023 0856   GFRNONAA 79 01/22/2020 0917   GFRAA 91 01/22/2020 0917    Lab Results  Component Value Date   WBC 6.1 02/09/2023   HGB 11.3 02/09/2023   HCT 33.7 (L) 02/09/2023   MCV 98 (H) 02/09/2023   PLT 199 02/09/2023   No results found for: "HGBA1C" Lab Results  Component Value Date   CHOL 172 02/09/2023   HDL 36 (L) 02/09/2023   LDLCALC 97 02/09/2023   TRIG 228 (H) 02/09/2023   CHOLHDL 4.8 (H) 02/09/2023   Lab Results  Component Value Date   TSH 5.690 (H) 02/09/2023     Assessment and Plan:  1. Acute URI (Primary) Likely viral etiology. Discussed self-limited nature of viral illnesses and advised conservative measures including rest, fluids, honey, and OTC cough/cold medications. Reviewed anecdotal evidence for zinc lozenges to reduce viral replication when used at the onset of symptoms.   Prescribing benzonatate and guaifenesin-codeine for symptomatic relief. She has been prescribed an albuterol inhaler in the past but states it made her heart race and she does not feel like she needs it at this time.   Contact precautions advised to limit spread. Encouraged mask wearing and good hand hygiene especially before meals.  Call if acutely worsening symptoms or if no improvement after Day 7 of illness  Discussed with patient that any lungs issues are best evaluated in person, so any additional evaluation of this problem will require an office visit with exam.   - benzonatate (TESSALON) 100 MG capsule; Take 1-2 capsules (100-200 mg total) by mouth 3 (three) times daily for 10 days.  Dispense: 30 capsule; Refill: 0 - guaiFENesin-codeine 100-10 MG/5ML syrup; Take 5 mLs by mouth 3 (three) times daily as needed for cough. May cause drowsiness  Dispense: 118 mL; Refill: 0    F/u PRN  I discussed the above assessment and treatment plan with the patient. The patient was provided an opportunity to ask questions and all were answered. The patient agreed with the plan and demonstrated an understanding of the instructions. The patient was advised to call back or seek an in-person evaluation if the symptoms worsen or if the condition fails to improve as anticipated. I provided a total time of 15 minutes inclusive of time utilized for medical chart review, information gathering, care coordination with staff, and documentation completion.  Alvester Morin, PA-C, DMSc, Nutritionist [facility] [phone]

## 2023-10-11 NOTE — Telephone Encounter (Signed)
Message from Weimar C sent at 10/11/2023 12:06 PM EST  Summary: Medication suggestion   Pt called and stated that she has a terrible cough. (No red/pink words) She wanted to ask what she could take otc to help.  Thanks!         Chief Complaint: cough Symptoms: coughing episodes that she loses her breath, chest and nasal congestion. Cannot produce phlegm, chest feels tight, sore throat Frequency: yesterday Pertinent Negatives: Patient denies fever Disposition: [] ED /[] Urgent Care (no appt availability in office) / [x] Appointment(In office/virtual)/ []  [place] [facility]/ [] Home Care/ [] Refused Recommended Disposition /[]  Mobile Bus/ []  Follow-up with PCP Additional Notes: assited pt with signing into her MyChart account and locating the appt and how to sign in on ethe appt. Advised pt to sleep with several pillows under her back so she is not supine, Advised saline nasal washing and Flonase OTC.    Reason for Disposition  SEVERE coughing spells (e.g., whooping sound after coughing, vomiting after coughing)  Answer Assessment - Initial Assessment Questions 1. ONSET: "When did the cough begin?"      Yesterday - 2. SEVERITY: "How bad is the cough today?"      Coughing episodes  3. SPUTUM: "Describe the color of your sputum" (none, dry cough; clear, white, yellow, green)     Yes - cannot produce  5. DIFFICULTY BREATHING: "Are you having difficulty breathing?" If Yes, ask: "How bad is it?" (e.g., mild, moderate, severe)    - MILD: No SOB at rest, mild SOB with walking, speaks normally in sentences, can lie down, no retractions, pulse < 100.    - MODERATE: SOB at rest, SOB with minimal exertion and prefers to sit, cannot lie down flat, speaks in phrases, mild retractions, audible wheezing, pulse 100-120.    - SEVERE: Very SOB at rest, speaks in single words, struggling to breathe, sitting hunched forward, retractions, pulse > 120      none 6. FEVER: "Do you have a fever?"  If Yes, ask: "What is your temperature, how was it measured, and when did it start?"     no 7. CARDIAC HISTORY: "Do you have any history of heart disease?" (e.g., heart attack, congestive heart failure)      no 8. LUNG HISTORY: "Do you have any history of lung disease?"  (e.g., pulmonary embolus, asthma, emphysema)     No  h/o lung nodules 2 years ago last xray 10. OTHER SYMPTOMS: "Do you have any other symptoms?" (e.g., runny nose, wheezing, chest pain)       Runny nose, chest feels tight when breathe  Protocols used: Cough - Acute Non-Productive-A-AH

## 2023-11-04 ENCOUNTER — Other Ambulatory Visit: Payer: Self-pay | Admitting: Internal Medicine

## 2023-11-04 DIAGNOSIS — I10 Essential (primary) hypertension: Secondary | ICD-10-CM

## 2023-11-06 NOTE — Telephone Encounter (Signed)
 Provider aware of abnormal lab result Requested Prescriptions  Pending Prescriptions Disp Refills   losartan  (COZAAR ) 100 MG tablet [Pharmacy Med Name: LOSARTAN  100MG  TABLETS] 90 tablet 0    Sig: TAKE 1 TABLET(100 MG) BY MOUTH DAILY     Cardiovascular:  Angiotensin Receptor Blockers Failed - 11/06/2023  3:38 PM      Failed - K in normal range and within 180 days    Potassium  Date Value Ref Range Status  08/22/2023 2.9 (L) 3.5 - 5.2 mmol/L Final         Passed - Cr in normal range and within 180 days    Creatinine, Ser  Date Value Ref Range Status  08/22/2023 0.99 0.57 - 1.00 mg/dL Final         Passed - Patient is not pregnant      Passed - Last BP in normal range    BP Readings from Last 1 Encounters:  08/22/23 120/70         Passed - Valid encounter within last 6 months    Recent Outpatient Visits           3 weeks ago Acute URI   [place] [facility] at [facility] [place] Manya Toribio SQUIBB, PA   2 months ago Benign hypertension   [place] [facility] at [facility], Leita DEL, MD   9 months ago Annual physical exam   [facility] at [facility], Leita DEL, MD   1 year ago Benign hypertension   [place] [facility] at [facility], Leita DEL, MD   1 year ago Annual physical exam   [facility] Medicine at [facility], Leita DEL, MD       Future Appointments             In 3 months Justus, Leita DEL, MD [facility] Medicine at [facility], [facility]

## 2023-12-09 ENCOUNTER — Other Ambulatory Visit: Payer: Self-pay | Admitting: Internal Medicine

## 2023-12-09 DIAGNOSIS — E876 Hypokalemia: Secondary | ICD-10-CM

## 2023-12-10 NOTE — Telephone Encounter (Signed)
 Requested medications are due for refill today.  unsure  Requested medications are on the active medications list.  yes  Last refill. 08/23/2023 #90 0 rf  Future visit scheduled.   yes  Notes to clinic.  Rx written to expire 11/21/2023 - Rx is expired.    Requested Prescriptions  Pending Prescriptions Disp Refills   potassium chloride  (KLOR-CON  M) 10 MEQ tablet [Pharmacy Med Name: POTASSIUM CL MICRO ER TABS] 90 tablet 0    Sig: TAKE 1 TABLET(10 MEQ) BY MOUTH DAILY     Endocrinology:  Minerals - Potassium Supplementation Failed - 12/10/2023  5:24 PM      Failed - K in normal range and within 360 days    Potassium  Date Value Ref Range Status  08/22/2023 2.9 (L) 3.5 - 5.2 mmol/L Final         Passed - Cr in normal range and within 360 days    Creatinine, Ser  Date Value Ref Range Status  08/22/2023 0.99 0.57 - 1.00 mg/dL Final         Passed - Valid encounter within last 12 months    Recent Outpatient Visits           2 months ago Acute URI   [place] [facility] at [facility] Leopoldo Rancher, PA   3 months ago Benign hypertension   [place] [facility] at [facility], Chales [place], MD   10 months ago Annual physical exam   [facility] at [facility], Chales [place], MD   1 year ago Benign hypertension   [place] [facility] at [facility], Chales [place], MD   1 year ago Annual physical exam   [facility] at [facility], Chales [place], MD       Future Appointments             In 2 months Gala Jubilee, Chales [place], MD [facility] at [facility], [facility]

## 2024-01-08 DIAGNOSIS — M1811 Unilateral primary osteoarthritis of first carpometacarpal joint, right hand: Secondary | ICD-10-CM | POA: Insufficient documentation

## 2024-01-08 DIAGNOSIS — M65341 Trigger finger, right ring finger: Secondary | ICD-10-CM | POA: Insufficient documentation

## 2024-01-08 DIAGNOSIS — M654 Radial styloid tenosynovitis [de Quervain]: Secondary | ICD-10-CM | POA: Insufficient documentation

## 2024-01-16 ENCOUNTER — Encounter: Payer: Self-pay | Admitting: Internal Medicine

## 2024-01-16 ENCOUNTER — Ambulatory Visit: Admitting: Internal Medicine

## 2024-01-16 ENCOUNTER — Ambulatory Visit: Payer: Self-pay | Admitting: Internal Medicine

## 2024-01-16 VITALS — BP 114/76 | HR 55 | Ht 62.0 in | Wt 132.1 lb

## 2024-01-16 DIAGNOSIS — R55 Syncope and collapse: Secondary | ICD-10-CM

## 2024-01-16 DIAGNOSIS — I10 Essential (primary) hypertension: Secondary | ICD-10-CM

## 2024-01-16 MED ORDER — METOPROLOL TARTRATE 25 MG PO TABS: 25.0000 mg | ORAL_TABLET | Freq: Two times a day (BID) | ORAL | 5 refills | Status: DC

## 2024-01-16 NOTE — Assessment & Plan Note (Signed)
 BP running low along with mild bradycardia and had syncopal episode last night Will reduce metoprolol to 25 mg bid, encourage more hydration, take KCL daily Follow up in one month at [facility] if needed

## 2024-01-16 NOTE — Telephone Encounter (Signed)
 Noted  KP

## 2024-01-16 NOTE — Telephone Encounter (Signed)
  Chief Complaint: dizzy, faint episode Symptoms: dizziness, patient got faint and has several seconds of fainting at work last night, weakness Frequency: several weeks- patient states she will feel dizzy and have to hold onto something until it passes- last night was first time she fainted Pertinent Negatives: Patient denies fever, chest pain, vomiting, diarrhea, bleeding  Disposition: [] ED /[] Urgent Care (no appt availability in office) / [x] Appointment(In office/virtual)/ []  [facility] [facility]/ [] Home Care/ [] Refused Recommended Disposition /[] [facility]/ []  Follow-up with PCP Additional Notes: Patient states she has been having episodes of dizziness- but lasy night she has fainting episode at work. Lasted seconds- she did hit her mouth.   Copied from CRM [id number]. Topic: Clinical - Red Word Triage >> Jan 16, 2024  8:02 AM Gery Pray wrote: Red Word that prompted transfer to Nurse Triage: Patient stated she went in to work she began feeling like she had to vomit last night.Patient then starts getting dizzy and then she passes out. 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 Reason for Disposition  [1] MODERATE dizziness (e.g., interferes with normal activities) AND [2] has NOT been evaluated by doctor (or NP/PA) for this  (Exception: Dizziness caused by heat exposure, sudden standing, or poor fluid intake.)  Answer Assessment - Initial Assessment Questions 1. DESCRIPTION: "Describe your dizziness."     Last night 2. LIGHTHEADED: "Do you feel lightheaded?" (e.g., somewhat faint, woozy, weak upon standing)     Nausea, patient feels faint- she may have fainted last night 3. VERTIGO: "Do you feel like either you or the room is spinning or tilting?" (i.e. vertigo)     na 4. SEVERITY: "How bad is it?"  "Do you feel like you are going to faint?" "Can you stand and walk?"   - MILD: Feels  slightly dizzy, but walking normally.   - MODERATE: Feels unsteady when walking, but not falling; interferes with normal activities (e.g., school, work).   - SEVERE: Unable to walk without falling, or requires assistance to walk without falling; feels like passing out now.      Feels light headed a lot of times 5. ONSET:  "When did the dizziness begin?"     Last night 6. AGGRAVATING FACTORS: "Does anything make it worse?" (e.g., standing, change in head position)     Fatigued 7. HEART RATE: "Can you tell me your heart rate?" "How many beats in 15 seconds?"  (Note: not all patients can do this)       After home felt fluttering 8. CAUSE: "What do you think is causing the dizziness?"     Not sure 9. RECURRENT SYMPTOM: "Have you had dizziness before?" If Yes, ask: "When was the last time?" "What happened that time?"     This was first faint- she has been felling dizzy- patient will hold onto something and it normally passes 10. OTHER SYMPTOMS: "Do you have any other symptoms?" (e.g., fever, chest pain, vomiting, diarrhea, bleeding)       no  Protocols used: Dizziness - Lightheadedness-A-AH

## 2024-01-16 NOTE — Progress Notes (Signed)
 Date:  01/16/2024   Name:  Darlene Newman   DOB:  July 05, 1961   MRN:  [id number]   Chief Complaint: Loss of Consciousness (Patient said she passed out at work, last night, no chest pain, just nauseous )  Loss of Consciousness This is a new problem. The current episode started today. The problem has been resolved. She lost consciousness for a period of less than 1 minute. The symptoms are aggravated by unknown factors. Associated symptoms include dizziness (intermittent over the past months.), light-headedness and nausea. Pertinent negatives include no abdominal pain, chest pain or fever.    Review of Systems  Constitutional:  Negative for chills, fatigue and fever.  HENT:  Positive for mouth sores (small cut inner upper lip) and tinnitus. Negative for ear pain and hearing loss.   Respiratory:  Negative for cough, chest tightness, shortness of breath and wheezing.   Cardiovascular:  Positive for syncope. Negative for chest pain.  Gastrointestinal:  Positive for nausea. Negative for abdominal pain.  Neurological:  Positive for dizziness (intermittent over the past months.) and light-headedness.  Psychiatric/Behavioral:  Negative for dysphoric mood and sleep disturbance. The patient is not nervous/anxious.      Lab Results  Component Value Date   NA 142 08/22/2023   K 2.9 (L) 08/22/2023   CO2 28 08/22/2023   GLUCOSE 116 (H) 08/22/2023   BUN 28 (H) 08/22/2023   CREATININE 0.99 08/22/2023   CALCIUM 9.7 08/22/2023   EGFR 65 08/22/2023   GFRNONAA 79 01/22/2020   Lab Results  Component Value Date   CHOL 172 02/09/2023   HDL 36 (L) 02/09/2023   LDLCALC 97 02/09/2023   TRIG 228 (H) 02/09/2023   CHOLHDL 4.8 (H) 02/09/2023   Lab Results  Component Value Date   TSH 5.690 (H) 02/09/2023   No results found for: "HGBA1C" Lab Results  Component Value Date   WBC 6.1 02/09/2023   HGB 11.3 02/09/2023   HCT 33.7 (L) 02/09/2023   MCV 98 (H) 02/09/2023   PLT 199 02/09/2023   Lab  Results  Component Value Date   ALT 23 02/09/2023   AST 22 02/09/2023   ALKPHOS 108 02/09/2023   BILITOT 0.5 02/09/2023   No results found for: "25OHVITD2", "25OHVITD3", "VD25OH"   Patient Active Problem List   Diagnosis Date Noted   Palmar fascial fibromatosis (dupuytren) 08/22/2023   H/O total vaginal hysterectomy 02/01/2022   Chronic left-sided low back pain with left-sided sciatica 08/03/2020   Atherosclerosis of aorta (HCC) 01/22/2020   Degenerative cervical disc 06/12/2017   Hypothyroidism due to acquired atrophy of thyroid 11/24/2016   Impingement syndrome of right shoulder 05/20/2015   Mixed hyperlipidemia 02/17/2015   Benign hypertension 02/17/2015   Climacteric 02/17/2015   History of fundoplication 02/17/2015   Tobacco use disorder, mild, in sustained remission 02/17/2015   Dermatitis contusiformis 05/31/2012   GERD without esophagitis 05/24/2012   Multiple lung nodules on CT 08/31/2011    Allergies  Allergen Reactions   Ibuprofen     Reduced renal fxn   Amlodipine Swelling    Past Surgical History:  Procedure Laterality Date   COLONOSCOPY  2015   COLONOSCOPY WITH ESOPHAGOGASTRODUODENOSCOPY (EGD)  2015   GASTRIC FUNDOPLICATION     HIATAL HERNIA REPAIR  2015   TOTAL VAGINAL HYSTERECTOMY  2012    Social History   Tobacco Use   Smoking status: Former    Current packs/day: 0.00    Average packs/day: 1.5 packs/day for 20.0 years (30.0  ttl pk-yrs)    Types: Cigarettes    Start date: 10/30/1986    Quit date: 10/30/2006    Years since quitting: 17.2   Smokeless tobacco: Never  Vaping Use   Vaping status: Never Used  Substance Use Topics   Alcohol use: No    Alcohol/week: 0.0 standard drinks of alcohol   Drug use: No     Medication list has been reviewed and updated.  Current Meds  Medication Sig   dicyclomine (BENTYL) 10 MG capsule TAKE 1 CAPSULE(10 MG) BY MOUTH IN THE MORNING AND AT BEDTIME   hydrochlorothiazide (HYDRODIURIL) 25 MG tablet TAKE 1  TABLET(25 MG) BY MOUTH DAILY   levothyroxine (SYNTHROID) 50 MCG tablet TAKE 1 TABLET(50 MCG) BY MOUTH DAILY BEFORE BREAKFAST   losartan (COZAAR) 100 MG tablet TAKE 1 TABLET(100 MG) BY MOUTH DAILY   omeprazole (PRILOSEC) 40 MG capsule TAKE 1 CAPSULE(40 MG) BY MOUTH DAILY   PARoxetine (PAXIL) 20 MG tablet TAKE 1 TABLET(20 MG) BY MOUTH DAILY   potassium chloride (KLOR-CON M) 10 MEQ tablet TAKE 1 TABLET(10 MEQ) BY MOUTH DAILY   rosuvastatin (CRESTOR) 5 MG tablet TAKE 1 TABLET(5 MG) BY MOUTH DAILY   [DISCONTINUED] metoprolol tartrate (LOPRESSOR) 50 MG tablet TAKE 1 TABLET(50 MG) BY MOUTH TWICE DAILY       01/16/2024    1:30 PM 10/11/2023    1:26 PM 08/22/2023    7:58 AM 02/09/2023    7:54 AM  GAD 7 : Generalized Anxiety Score  Nervous, Anxious, on Edge 0 0 0 0  Control/stop worrying 0 0 0 0  Worry too much - different things 0 0 0 0  Trouble relaxing 0 0 0 0  Restless 0 0 0 0  Easily annoyed or irritable 0 0 0 1  Afraid - awful might happen 0 0 0 0  Total GAD 7 Score 0 0 0 1  Anxiety Difficulty Not difficult at all Not difficult at all Not difficult at all Not difficult at all       01/16/2024    1:30 PM 10/11/2023    1:26 PM 08/22/2023    7:58 AM  Depression screen PHQ 2/9  Decreased Interest 1 0 1  Down, Depressed, Hopeless 0 0 0  PHQ - 2 Score 1 0 1  Altered sleeping 1 0 1  Tired, decreased energy 1 0 1  Change in appetite 0 0 0  Feeling bad or failure about yourself  0 0 0  Trouble concentrating 0 0 0  Moving slowly or fidgety/restless 0 0 0  Suicidal thoughts 0 0 0  PHQ-9 Score 3 0 3  Difficult doing work/chores Not difficult at all Not difficult at all Not difficult at all    BP Readings from Last 3 Encounters:  01/16/24 114/76  08/22/23 120/70  02/09/23 124/76    Physical Exam Vitals and nursing note reviewed.  Constitutional:      General: She is not in acute distress.    Appearance: Normal appearance. She is well-developed.  HENT:     Head: Normocephalic  and atraumatic.  Eyes:     Extraocular Movements:     Right eye: Normal extraocular motion and no nystagmus.     Left eye: Normal extraocular motion and no nystagmus.     Comments: Mild lightheadedness with lateral gaze  Neck:     Vascular: No carotid bruit.  Cardiovascular:     Rate and Rhythm: Regular rhythm. Bradycardia present.     Heart sounds: No  murmur heard. Pulmonary:     Effort: Pulmonary effort is normal. No respiratory distress.     Breath sounds: No wheezing or rhonchi.  Musculoskeletal:     Cervical back: Normal range of motion.     Right lower leg: No edema.     Left lower leg: No edema.  Lymphadenopathy:     Cervical: No cervical adenopathy.  Skin:    General: Skin is warm and dry.     Findings: No rash.  Neurological:     Mental Status: She is alert and oriented to person, place, and time.     Sensory: Sensation is intact.     Motor: Motor function is intact.  Psychiatric:        Mood and Affect: Mood normal.        Behavior: Behavior normal.     Wt Readings from Last 3 Encounters:  01/16/24 132 lb 2 oz (59.9 kg)  08/22/23 137 lb (62.1 kg)  02/09/23 139 lb (63 kg)    BP 114/76   Pulse (!) 55   Ht 5\' 2"  (1.575 m)   Wt 132 lb 2 oz (59.9 kg)   SpO2 99%   BMI 24.17 kg/m   Assessment and Plan:  Problem List Items Addressed This Visit       Unprioritized   Benign hypertension (Chronic)   BP running low along with mild bradycardia and had syncopal episode last night Will reduce metoprolol to 25 mg bid, encourage more hydration, take KCL daily Follow up in one month at [facility] if needed      Relevant Medications   metoprolol tartrate (LOPRESSOR) 25 MG tablet   Other Relevant Orders   CBC with Differential/Platelet   Basic metabolic panel   Other Visit Diagnoses       Vasovagal syncope    -  Primary   will reduce dose of metoprolol to 25 mg bid check CBC and BMP   Relevant Orders   EKG 12-Lead (Completed)       No follow-ups on  file.    Reubin Milan, MD [facility] [place]

## 2024-01-17 ENCOUNTER — Encounter: Payer: Self-pay | Admitting: Internal Medicine

## 2024-01-17 LAB — BASIC METABOLIC PANEL
BUN/Creatinine Ratio: 26 (ref 12–28)
BUN: 21 mg/dL (ref 8–27)
CO2: 29 mmol/L (ref 20–29)
Calcium: 9.7 mg/dL (ref 8.7–10.3)
Chloride: 100 mmol/L (ref 96–106)
Creatinine, Ser: 0.82 mg/dL (ref 0.57–1.00)
Glucose: 103 mg/dL — ABNORMAL HIGH (ref 70–99)
Potassium: 3.6 mmol/L (ref 3.5–5.2)
Sodium: 141 mmol/L (ref 134–144)
eGFR: 81 mL/min/{1.73_m2} (ref >59)

## 2024-01-17 LAB — CBC WITH DIFFERENTIAL/PLATELET
Basophils Absolute: 0 10*3/uL (ref 0.0–0.2)
Basos: 1 %
EOS (ABSOLUTE): 0.2 10*3/uL (ref 0.0–0.4)
Eos: 2 %
Hematocrit: 37.4 % (ref 34.0–46.6)
Hemoglobin: 12.6 g/dL (ref 11.1–15.9)
Immature Grans (Abs): 0 10*3/uL (ref 0.0–0.1)
Immature Granulocytes: 0 %
Lymphocytes Absolute: 1.5 10*3/uL (ref 0.7–3.1)
Lymphs: 19 %
MCH: 32.7 pg (ref 26.6–33.0)
MCHC: 33.7 g/dL (ref 31.5–35.7)
MCV: 97 fL (ref 79–97)
Monocytes Absolute: 0.8 10*3/uL (ref 0.1–0.9)
Monocytes: 9 %
Neutrophils Absolute: 5.7 10*3/uL (ref 1.4–7.0)
Neutrophils: 69 %
Platelets: 252 10*3/uL (ref 150–450)
RBC: 3.85 x10E6/uL (ref 3.77–5.28)
RDW: 13 % (ref 11.7–15.4)
WBC: 8.2 10*3/uL (ref 3.4–10.8)

## 2024-01-28 ENCOUNTER — Ambulatory Visit: Payer: Self-pay

## 2024-01-28 NOTE — Telephone Encounter (Signed)
  Chief Complaint: Low Bp readings.  Symptoms: lethargic Frequency: ongoing Pertinent Negatives: Patient denies dizzy - lightheaded Disposition: [] ED /[] Urgent Care (no appt availability in office) / [x] Appointment(In office/virtual)/ []  [place]/ [] Home Care/ [] Refused Recommended Disposition /[] [facility]/ []  Follow-up with PCP Additional Notes: Pt reports that he blood pressure continues to be low, despite recent adjustment of HTN medications. Pt states she is tired. Appt made for tomorrow morning. Pt was not interested in a vv this evening.   Copied from CRM [id number]. Topic: Clinical - Red Word Triage >> Jan 28, 2024  4:38 PM Marlow Baars wrote: Red Word that prompted transfer to Nurse Triage: The patient called in she passed out last week at work and since then saw her provider and was prescribed a low dosage of her bp medication. She says she still feels very lethargic and has had bp of 98/54- 103/62 and 102/58 with heart rate as low as 57. I will transfer her to E2C2 NT Reason for Disposition  [1] Systolic BP 90-110 AND [2] taking blood pressure medications AND [3] NOT dizzy, lightheaded or weak  Answer Assessment - Initial Assessment Questions 1. BLOOD PRESSURE: "What is the blood pressure?" "Did you take at least two measurements 5 minutes apart?"     102/58 2. ONSET: "When did you take your blood pressure?"     A few minutes ago 3. HOW: "How did you obtain the blood pressure?" (e.g., visiting nurse, automatic home BP monitor)     Home monitor 4. HISTORY: "Do you have a history of low blood pressure?" "What is your blood pressure normally?"     HTN 5. MEDICINES: "Are you taking any medications for blood pressure?" If Yes, ask: "Have they been changed recently?"     yes 7. OTHER SYMPTOMS: "Have you been sick recently?" "Have you had a recent injury?"     lethargic  Protocols used: Blood Pressure - Low-A-AH

## 2024-01-29 ENCOUNTER — Ambulatory Visit: Admitting: Internal Medicine

## 2024-01-29 ENCOUNTER — Encounter: Payer: Self-pay | Admitting: Internal Medicine

## 2024-01-29 VITALS — BP 110/70 | HR 67 | Temp 97.7°F | Ht 62.0 in | Wt 134.1 lb

## 2024-01-29 DIAGNOSIS — I10 Essential (primary) hypertension: Secondary | ICD-10-CM

## 2024-01-29 NOTE — Progress Notes (Signed)
 Date:  01/29/2024   Name:  Darlene Newman   DOB:  01-18-1961   MRN:  [id number]   Chief Complaint: Hypotension (Patient said her blood pressure has been running low, measures at home, the lowered it has been 98/50)  Hypertension This is a chronic problem. Progression since onset: dropping low. Associated symptoms include malaise/fatigue. Pertinent negatives include no blurred vision, chest pain, headaches, palpitations, peripheral edema or shortness of breath. Past treatments include beta blockers, angiotensin blockers and diuretics. The current treatment provides significant improvement. There are no compliance problems.  There is no history of kidney disease, CAD/MI or CVA.    Review of Systems  Constitutional:  Positive for fatigue and malaise/fatigue. Negative for chills and fever.  Eyes:  Negative for blurred vision.  Respiratory:  Negative for chest tightness, shortness of breath and wheezing.   Cardiovascular:  Negative for chest pain, palpitations and leg swelling.  Neurological:  Negative for dizziness, light-headedness and headaches.     Lab Results  Component Value Date   NA 141 01/16/2024   K 3.6 01/16/2024   CO2 29 01/16/2024   GLUCOSE 103 (H) 01/16/2024   BUN 21 01/16/2024   CREATININE 0.82 01/16/2024   CALCIUM 9.7 01/16/2024   EGFR 81 01/16/2024   GFRNONAA 79 01/22/2020   Lab Results  Component Value Date   CHOL 172 02/09/2023   HDL 36 (L) 02/09/2023   LDLCALC 97 02/09/2023   TRIG 228 (H) 02/09/2023   CHOLHDL 4.8 (H) 02/09/2023   Lab Results  Component Value Date   TSH 5.690 (H) 02/09/2023   No results found for: "HGBA1C" Lab Results  Component Value Date   WBC 8.2 01/16/2024   HGB 12.6 01/16/2024   HCT 37.4 01/16/2024   MCV 97 01/16/2024   PLT 252 01/16/2024   Lab Results  Component Value Date   ALT 23 02/09/2023   AST 22 02/09/2023   ALKPHOS 108 02/09/2023   BILITOT 0.5 02/09/2023   No results found for: "25OHVITD2", "25OHVITD3",  "VD25OH"   Patient Active Problem List   Diagnosis Date Noted   Palmar fascial fibromatosis (dupuytren) 08/22/2023   H/O total vaginal hysterectomy 02/01/2022   Chronic left-sided low back pain with left-sided sciatica 08/03/2020   Atherosclerosis of aorta (HCC) 01/22/2020   Degenerative cervical disc 06/12/2017   Hypothyroidism due to acquired atrophy of thyroid 11/24/2016   Impingement syndrome of right shoulder 05/20/2015   Mixed hyperlipidemia 02/17/2015   Benign hypertension 02/17/2015   Climacteric 02/17/2015   History of fundoplication 02/17/2015   Tobacco use disorder, mild, in sustained remission 02/17/2015   Dermatitis contusiformis 05/31/2012   GERD without esophagitis 05/24/2012   Multiple lung nodules on CT 08/31/2011    Allergies  Allergen Reactions   Ibuprofen     Reduced renal fxn   Amlodipine Swelling    Past Surgical History:  Procedure Laterality Date   COLONOSCOPY  2015   COLONOSCOPY WITH ESOPHAGOGASTRODUODENOSCOPY (EGD)  2015   GASTRIC FUNDOPLICATION     HIATAL HERNIA REPAIR  2015   TOTAL VAGINAL HYSTERECTOMY  2012    Social History   Tobacco Use   Smoking status: Former    Current packs/day: 0.00    Average packs/day: 1.5 packs/day for 20.0 years (30.0 ttl pk-yrs)    Types: Cigarettes    Start date: 10/30/1986    Quit date: 10/30/2006    Years since quitting: 17.2   Smokeless tobacco: Never  Vaping Use   Vaping status: Never Used  Substance Use Topics   Alcohol use: No    Alcohol/week: 0.0 standard drinks of alcohol   Drug use: No     Medication list has been reviewed and updated.  Current Meds  Medication Sig   dicyclomine (BENTYL) 10 MG capsule TAKE 1 CAPSULE(10 MG) BY MOUTH IN THE MORNING AND AT BEDTIME   hydrochlorothiazide (HYDRODIURIL) 25 MG tablet TAKE 1 TABLET(25 MG) BY MOUTH DAILY   levothyroxine (SYNTHROID) 50 MCG tablet TAKE 1 TABLET(50 MCG) BY MOUTH DAILY BEFORE BREAKFAST   losartan (COZAAR) 100 MG tablet TAKE 1 TABLET(100  MG) BY MOUTH DAILY   omeprazole (PRILOSEC) 40 MG capsule TAKE 1 CAPSULE(40 MG) BY MOUTH DAILY   PARoxetine (PAXIL) 20 MG tablet TAKE 1 TABLET(20 MG) BY MOUTH DAILY   potassium chloride (KLOR-CON M) 10 MEQ tablet TAKE 1 TABLET(10 MEQ) BY MOUTH DAILY   rosuvastatin (CRESTOR) 5 MG tablet TAKE 1 TABLET(5 MG) BY MOUTH DAILY   [DISCONTINUED] metoprolol tartrate (LOPRESSOR) 25 MG tablet Take 1 tablet (25 mg total) by mouth 2 (two) times daily. TAKE 1 TABLET(50 MG) BY MOUTH TWICE DAILY       01/29/2024    9:10 AM 01/16/2024    1:30 PM 10/11/2023    1:26 PM 08/22/2023    7:58 AM  GAD 7 : Generalized Anxiety Score  Nervous, Anxious, on Edge 0 0 0 0  Control/stop worrying 0 0 0 0  Worry too much - different things 0 0 0 0  Trouble relaxing 0 0 0 0  Restless 0 0 0 0  Easily annoyed or irritable 0 0 0 0  Afraid - awful might happen 0 0 0 0  Total GAD 7 Score 0 0 0 0  Anxiety Difficulty Not difficult at all Not difficult at all Not difficult at all Not difficult at all       01/29/2024    9:10 AM 01/16/2024    1:30 PM 10/11/2023    1:26 PM  Depression screen PHQ 2/9  Decreased Interest 2 1 0  Down, Depressed, Hopeless 0 0 0  PHQ - 2 Score 2 1 0  Altered sleeping 0 1 0  Tired, decreased energy 2 1 0  Change in appetite 0 0 0  Feeling bad or failure about yourself  0 0 0  Trouble concentrating 0 0 0  Moving slowly or fidgety/restless 0 0 0  Suicidal thoughts 0 0 0  PHQ-9 Score 4 3 0  Difficult doing work/chores Not difficult at all Not difficult at all Not difficult at all    BP Readings from Last 3 Encounters:  01/29/24 110/70  01/16/24 114/76  08/22/23 120/70    Physical Exam Constitutional:      Appearance: Normal appearance.  Cardiovascular:     Rate and Rhythm: Normal rate and regular rhythm.     Heart sounds: No murmur heard. Pulmonary:     Effort: Respiratory distress present.  Musculoskeletal:     Cervical back: Normal range of motion.     Right lower leg: No edema.      Left lower leg: No edema.  Neurological:     Mental Status: She is alert.  Psychiatric:        Mood and Affect: Mood normal.        Behavior: Behavior normal.     Wt Readings from Last 3 Encounters:  01/29/24 134 lb 2 oz (60.8 kg)  01/16/24 132 lb 2 oz (59.9 kg)  08/22/23 137 lb (62.1 kg)  BP 110/70   Pulse 67   Temp 97.7 F (36.5 C)   Ht 5\' 2"  (1.575 m)   Wt 134 lb 2 oz (60.8 kg)   SpO2 94%   BMI 24.53 kg/m   Assessment and Plan:  Problem List Items Addressed This Visit       Unprioritized   Benign hypertension - Primary (Chronic)   Blood pressure running low at home < 100; highest 116 Still feels tired despite reducing the dose of metoprolol. Will stop metoprolol completely - continue losartan and hydrochlorothiazide Call if sbp will < 120       No follow-ups on file.    Reubin Milan, MD [facility] [place]

## 2024-01-29 NOTE — Telephone Encounter (Signed)
 Noted   Pt has a appointment.  KP

## 2024-01-29 NOTE — Patient Instructions (Signed)
 Stop the metoprolol.  Continue losartan and hydrochlorothiazide.  Monitor blood pressure and if still less than 120, call me for instructions.

## 2024-01-29 NOTE — Assessment & Plan Note (Signed)
 Blood pressure running low at home < 100; highest 116 Still feels tired despite reducing the dose of metoprolol. Will stop metoprolol completely - continue losartan and hydrochlorothiazide Call if sbp will < 120

## 2024-02-02 ENCOUNTER — Other Ambulatory Visit: Payer: Self-pay | Admitting: Internal Medicine

## 2024-02-02 DIAGNOSIS — I10 Essential (primary) hypertension: Secondary | ICD-10-CM

## 2024-02-04 NOTE — Telephone Encounter (Signed)
 Requested Prescriptions  Pending Prescriptions Disp Refills   losartan (COZAAR) 100 MG tablet [Pharmacy Med Name: LOSARTAN 100MG  TABLETS] 90 tablet 0    Sig: TAKE 1 TABLET(100 MG) BY MOUTH DAILY     Cardiovascular:  Angiotensin Receptor Blockers Failed - 02/04/2024 12:30 PM      Failed - Valid encounter within last 6 months    Recent Outpatient Visits           6 days ago Benign hypertension   [place] [facility] at [facility], Nyoka Cowden, MD   2 weeks ago Vasovagal syncope   [place] [facility] at [facility], Nyoka Cowden, MD       Future Appointments             In 3 weeks Reubin Milan, MD [facility] Medicine at [facility]            Passed - Cr in normal range and within 180 days    Creatinine, Ser  Date Value Ref Range Status  01/16/2024 0.82 0.57 - 1.00 mg/dL Final         Passed - K in normal range and within 180 days    Potassium  Date Value Ref Range Status  01/16/2024 3.6 3.5 - 5.2 mmol/L Final         Passed - Patient is not pregnant      Passed - Last BP in normal range    BP Readings from Last 1 Encounters:  01/29/24 110/70

## 2024-02-11 ENCOUNTER — Other Ambulatory Visit: Payer: Self-pay | Admitting: Internal Medicine

## 2024-02-11 DIAGNOSIS — E034 Atrophy of thyroid (acquired): Secondary | ICD-10-CM

## 2024-02-12 NOTE — Telephone Encounter (Signed)
 Please review. Last prescribed in 08/09/23.  KP

## 2024-02-12 NOTE — Telephone Encounter (Signed)
 Requested medications are due for refill today.  yes  Requested medications are on the active medications list.  yes  Last refill. 08/01/2023 #90 0 rf  Future visit scheduled.   yes  Notes to clinic.  Abnormal labs.    Requested Prescriptions  Pending Prescriptions Disp Refills   levothyroxine (SYNTHROID) 50 MCG tablet [Pharmacy Med Name: LEVOTHYROXINE 0.05MG  ( ) TAB] 90 tablet 0    Sig: TAKE 1 TABLET(50 MCG) BY MOUTH DAILY BEFORE BREAKFAST     Endocrinology:  Hypothyroid Agents Failed - 02/12/2024 10:54 AM      Failed - TSH in normal range and within 360 days    TSH  Date Value Ref Range Status  02/09/2023 5.690 (H) 0.450 - 4.500 uIU/mL Final         Failed - Valid encounter within last 12 months    Recent Outpatient Visits           2 weeks ago Benign hypertension   [place] [facility] at [facility], Chales Colorado, MD   3 weeks ago Vasovagal syncope   [facility] at [facility], Chales Colorado, MD       Future Appointments             In 1 week Gala Jubilee, Chales Colorado, MD [facility] at [facility], [facility]

## 2024-02-24 ENCOUNTER — Encounter: Payer: Self-pay | Admitting: Internal Medicine

## 2024-02-25 ENCOUNTER — Ambulatory Visit (INDEPENDENT_AMBULATORY_CARE_PROVIDER_SITE_OTHER): Payer: Self-pay | Admitting: Internal Medicine

## 2024-02-25 ENCOUNTER — Encounter: Payer: Self-pay | Admitting: Internal Medicine

## 2024-02-25 VITALS — BP 122/70 | HR 75 | Ht 62.0 in | Wt 135.5 lb

## 2024-02-25 DIAGNOSIS — E876 Hypokalemia: Secondary | ICD-10-CM

## 2024-02-25 DIAGNOSIS — E034 Atrophy of thyroid (acquired): Secondary | ICD-10-CM

## 2024-02-25 DIAGNOSIS — K219 Gastro-esophageal reflux disease without esophagitis: Secondary | ICD-10-CM

## 2024-02-25 DIAGNOSIS — I10 Essential (primary) hypertension: Secondary | ICD-10-CM

## 2024-02-25 DIAGNOSIS — Z1211 Encounter for screening for malignant neoplasm of colon: Secondary | ICD-10-CM | POA: Diagnosis not present

## 2024-02-25 DIAGNOSIS — Z1231 Encounter for screening mammogram for malignant neoplasm of breast: Secondary | ICD-10-CM

## 2024-02-25 DIAGNOSIS — Z0000 Encounter for general adult medical examination without abnormal findings: Secondary | ICD-10-CM

## 2024-02-25 DIAGNOSIS — E782 Mixed hyperlipidemia: Secondary | ICD-10-CM | POA: Diagnosis not present

## 2024-02-25 MED ORDER — LOSARTAN POTASSIUM 100 MG PO TABS: ORAL_TABLET | ORAL | 3 refills | Status: AC

## 2024-02-25 MED ORDER — POTASSIUM CHLORIDE CRYS ER 10 MEQ PO TBCR: 10.0000 meq | EXTENDED_RELEASE_TABLET | Freq: Every day | ORAL | 3 refills | Status: AC

## 2024-02-25 MED ORDER — PAROXETINE HCL 20 MG PO TABS: ORAL_TABLET | ORAL | 3 refills | Status: AC

## 2024-02-25 MED ORDER — OMEPRAZOLE 40 MG PO CPDR: DELAYED_RELEASE_CAPSULE | ORAL | 3 refills | Status: AC

## 2024-02-25 MED ORDER — HYDROCHLOROTHIAZIDE 25 MG PO TABS: 25.0000 mg | ORAL_TABLET | Freq: Every day | ORAL | 3 refills | Status: AC

## 2024-02-25 MED ORDER — DICYCLOMINE HCL 10 MG PO CAPS: 10.0000 mg | ORAL_CAPSULE | Freq: Two times a day (BID) | ORAL | 3 refills | Status: AC

## 2024-02-25 NOTE — Assessment & Plan Note (Signed)
 LDL is  Lab Results  Component Value Date   LDLCALC 97 02/09/2023   Current regimen is Crestor .  No medication side effects noted. Goal LDL is <70.

## 2024-02-25 NOTE — Assessment & Plan Note (Signed)
 Supplemented

## 2024-02-25 NOTE — Assessment & Plan Note (Addendum)
 Blood pressure is well controlled.  She has more energy off Metoprolol . Current medications losartan  and hydrochlorothiazide .  BP at home have ranged 100-130 systolic. Will continue same regimen along with efforts to limit dietary sodium.

## 2024-02-25 NOTE — Assessment & Plan Note (Signed)
 Reflux symptoms are controlled on PPI. Patient denies red flag symptoms - no melena, weight loss, dysphagia.

## 2024-02-25 NOTE — Patient Instructions (Addendum)
 Call [facility] to schedule your mammogram at [phone].  Consider getting the Shingles vaccines

## 2024-02-25 NOTE — Progress Notes (Signed)
 Date:  02/25/2024   Name:  Darlene Newman   DOB:  [birth year]   MRN:  [id number]   Chief Complaint: Annual Exam Darlene Newman is a 63 y.o. female who presents today for her Complete Annual Exam. She feels fairly well. She reports exercising none. She reports she is sleeping poorly. Breast complaints none.  Health Maintenance  Topic Date Due   HIV Screening  Never done   Zoster (Shingles) Vaccine (1 of 2) Never done   Colon Cancer Screening  10/31/2023   COVID-19 Vaccine (3 - 2024-25 season) 03/12/2024*   Mammogram  03/29/2024   Flu Shot  05/30/2024   DTaP/Tdap/Td vaccine (2 - Td or Tdap) 02/04/2032   Hepatitis C Screening  Completed   HPV Vaccine  Aged Out   Meningitis B Vaccine  Aged Out  *Topic was postponed. The date shown is not the original due date.    Hypertension This is a chronic problem. The problem is controlled. Pertinent negatives include no chest pain, headaches, palpitations or shortness of breath. Past treatments include angiotensin blockers and diuretics. The current treatment provides significant improvement. There is no history of kidney disease, CAD/MI or CVA. Identifiable causes of hypertension include a thyroid  problem.  Gastroesophageal Reflux She complains of heartburn. She reports no abdominal pain, no chest pain, no coughing or no wheezing. This is a recurrent problem. The problem occurs rarely. Pertinent negatives include no fatigue. She has tried a PPI for the symptoms.  Thyroid  Problem Presents for follow-up visit. Patient reports no anxiety, constipation, diarrhea, fatigue or palpitations. The symptoms have been stable. Her past medical history is significant for hyperlipidemia.  Hyperlipidemia This is a chronic problem. The problem is controlled. Pertinent negatives include no chest pain, myalgias or shortness of breath.    Review of Systems  Constitutional:  Negative for fatigue and unexpected weight change.  HENT:  Negative for trouble  swallowing.   Eyes:  Negative for visual disturbance.  Respiratory:  Negative for cough, chest tightness, shortness of breath and wheezing.   Cardiovascular:  Negative for chest pain, palpitations and leg swelling.  Gastrointestinal:  Positive for heartburn. Negative for abdominal pain, constipation and diarrhea.  Musculoskeletal:  Negative for arthralgias and myalgias.  Neurological:  Negative for dizziness, weakness, light-headedness and headaches.  Psychiatric/Behavioral:  Negative for dysphoric mood and sleep disturbance. The patient is not nervous/anxious.      Lab Results  Component Value Date   NA 141 01/16/2024   K 3.6 01/16/2024   CO2 29 01/16/2024   GLUCOSE 103 (H) 01/16/2024   BUN 21 01/16/2024   CREATININE 0.82 01/16/2024   CALCIUM  9.7 01/16/2024   EGFR 81 01/16/2024   GFRNONAA 79 01/22/2020   Lab Results  Component Value Date   CHOL 172 02/09/2023   HDL 36 (L) 02/09/2023   LDLCALC 97 02/09/2023   TRIG 228 (H) 02/09/2023   CHOLHDL 4.8 (H) 02/09/2023   Lab Results  Component Value Date   TSH 5.690 (H) 02/09/2023   No results found for: "HGBA1C" Lab Results  Component Value Date   WBC 8.2 01/16/2024   HGB 12.6 01/16/2024   HCT 37.4 01/16/2024   MCV 97 01/16/2024   PLT 252 01/16/2024   Lab Results  Component Value Date   ALT 23 02/09/2023   AST 22 02/09/2023   ALKPHOS 108 02/09/2023   BILITOT 0.5 02/09/2023   No results found for: "25OHVITD2", "25OHVITD3", "VD25OH"   Patient Active Problem List   Diagnosis  Date Noted   Radial styloid tenosynovitis (de quervain) 01/08/2024   Unilateral primary osteoarthritis of first carpometacarpal joint, right hand 01/08/2024   Acquired trigger finger of right ring finger 01/08/2024   Palmar fascial fibromatosis (dupuytren) 08/22/2023   H/O total vaginal hysterectomy 02/01/2022   Chronic left-sided low back pain with left-sided sciatica 08/03/2020   Atherosclerosis of aorta (HCC) 01/22/2020   Degenerative  cervical disc 06/12/2017   Hypothyroidism due to acquired atrophy of thyroid  11/24/2016   Impingement syndrome of right shoulder 05/20/2015   Mixed hyperlipidemia 02/17/2015   Benign hypertension 02/17/2015   Climacteric 02/17/2015   History of fundoplication 02/17/2015   Tobacco use disorder, mild, in sustained remission 02/17/2015   Dermatitis contusiformis 05/31/2012   GERD without esophagitis 05/24/2012   Multiple lung nodules on CT 08/31/2011    Allergies  Allergen Reactions   Ibuprofen     Reduced renal fxn   Amlodipine  Swelling    Past Surgical History:  Procedure Laterality Date   COLONOSCOPY  2015   COLONOSCOPY WITH ESOPHAGOGASTRODUODENOSCOPY (EGD)  2015   GASTRIC FUNDOPLICATION     HIATAL HERNIA REPAIR  2015   TOTAL VAGINAL HYSTERECTOMY  2012    Social History   Tobacco Use   Smoking status: Former    Current packs/day: 0.00    Average packs/day: 1.5 packs/day for 20.0 years (30.0 ttl pk-yrs)    Types: Cigarettes    Start date: 10/30/1986    Quit date: 10/30/2006    Years since quitting: 17.3   Smokeless tobacco: Never  Vaping Use   Vaping status: Never Used  Substance Use Topics   Alcohol use: No    Alcohol/week: 0.0 standard drinks of alcohol   Drug use: No     Medication list has been reviewed and updated.  Current Meds  Medication Sig   levothyroxine  (SYNTHROID ) 50 MCG tablet TAKE 1 TABLET(50 MCG) BY MOUTH DAILY BEFORE BREAKFAST   rosuvastatin  (CRESTOR ) 5 MG tablet TAKE 1 TABLET(5 MG) BY MOUTH DAILY   [DISCONTINUED] dicyclomine  (BENTYL ) 10 MG capsule TAKE 1 CAPSULE(10 MG) BY MOUTH IN THE MORNING AND AT BEDTIME   [DISCONTINUED] hydrochlorothiazide  (HYDRODIURIL ) 25 MG tablet TAKE 1 TABLET(25 MG) BY MOUTH DAILY   [DISCONTINUED] losartan  (COZAAR ) 100 MG tablet TAKE 1 TABLET(100 MG) BY MOUTH DAILY   [DISCONTINUED] omeprazole  (PRILOSEC) 40 MG capsule TAKE 1 CAPSULE(40 MG) BY MOUTH DAILY   [DISCONTINUED] PARoxetine  (PAXIL ) 20 MG tablet TAKE 1 TABLET(20  MG) BY MOUTH DAILY   [DISCONTINUED] potassium chloride  (KLOR-CON  M) 10 MEQ tablet TAKE 1 TABLET(10 MEQ) BY MOUTH DAILY       02/25/2024    8:56 AM 01/29/2024    9:10 AM 01/16/2024    1:30 PM 10/11/2023    1:26 PM  GAD 7 : Generalized Anxiety Score  Nervous, Anxious, on Edge 0 0 0 0  Control/stop worrying 0 0 0 0  Worry too much - different things 0 0 0 0  Trouble relaxing 0 0 0 0  Restless 0 0 0 0  Easily annoyed or irritable 0 0 0 0  Afraid - awful might happen 0 0 0 0  Total GAD 7 Score 0 0 0 0  Anxiety Difficulty Not difficult at all Not difficult at all Not difficult at all Not difficult at all       02/25/2024    8:56 AM 01/29/2024    9:10 AM 01/16/2024    1:30 PM  Depression screen PHQ 2/9  Decreased Interest 1 2 1  Down, Depressed, Hopeless 0 0 0  PHQ - 2 Score 1 2 1   Altered sleeping 1 0 1  Tired, decreased energy 1 2 1   Change in appetite 0 0 0  Feeling bad or failure about yourself  0 0 0  Trouble concentrating 0 0 0  Moving slowly or fidgety/restless 0 0 0  Suicidal thoughts 0 0 0  PHQ-9 Score 3 4 3   Difficult doing work/chores Not difficult at all Not difficult at all Not difficult at all    BP Readings from Last 3 Encounters:  02/25/24 122/70  01/29/24 110/70  01/16/24 114/76    Physical Exam Vitals and nursing note reviewed.  Constitutional:      General: She is not in acute distress.    Appearance: She is well-developed.  HENT:     Head: Normocephalic and atraumatic.     Right Ear: Tympanic membrane and ear canal normal.     Left Ear: Tympanic membrane and ear canal normal.     Nose:     Right Sinus: No maxillary sinus tenderness.     Left Sinus: No maxillary sinus tenderness.  Eyes:     General: No scleral icterus.       Right eye: No discharge.        Left eye: No discharge.     Conjunctiva/sclera: Conjunctivae normal.  Neck:     Thyroid : No thyromegaly.     Vascular: No carotid bruit.  Cardiovascular:     Rate and Rhythm: Normal rate and  regular rhythm.     Pulses: Normal pulses.     Heart sounds: Normal heart sounds.  Pulmonary:     Effort: Pulmonary effort is normal. No respiratory distress.     Breath sounds: No wheezing.  Abdominal:     General: Bowel sounds are normal.     Palpations: Abdomen is soft.     Tenderness: There is no abdominal tenderness.  Musculoskeletal:     Cervical back: Normal range of motion. No erythema.     Right lower leg: No edema.     Left lower leg: No edema.  Lymphadenopathy:     Cervical: No cervical adenopathy.  Skin:    General: Skin is warm and dry.     Findings: No rash.  Neurological:     Mental Status: She is alert and oriented to person, place, and time.     Cranial Nerves: No cranial nerve deficit.     Sensory: No sensory deficit.     Deep Tendon Reflexes: Reflexes are normal and symmetric.  Psychiatric:        Attention and Perception: Attention normal.        Mood and Affect: Mood normal.     Wt Readings from Last 3 Encounters:  02/25/24 135 lb 8 oz (61.5 kg)  01/29/24 134 lb 2 oz (60.8 kg)  01/16/24 132 lb 2 oz (59.9 kg)    BP 122/70   Pulse 75   Ht 5\' 2"  (1.575 m)   Wt 135 lb 8 oz (61.5 kg)   SpO2 99%   BMI 24.78 kg/m   Assessment and Plan:  Problem List Items Addressed This Visit       Unprioritized   Mixed hyperlipidemia (Chronic)   LDL is  Lab Results  Component Value Date   LDLCALC 97 02/09/2023   Current regimen is Crestor .  No medication side effects noted. Goal LDL is <70.       Relevant Medications  losartan  (COZAAR ) 100 MG tablet   hydrochlorothiazide  (HYDRODIURIL ) 25 MG tablet   Other Relevant Orders   Lipid panel   Benign hypertension (Chronic)   Blood pressure is well controlled.  She has more energy off Metoprolol . Current medications losartan  and hydrochlorothiazide .  BP at home have ranged 100-130 systolic. Will continue same regimen along with efforts to limit dietary sodium.       Relevant Medications   losartan   (COZAAR ) 100 MG tablet   hydrochlorothiazide  (HYDRODIURIL ) 25 MG tablet   Other Relevant Orders   CBC with Differential/Platelet   Comprehensive metabolic panel with GFR   GERD without esophagitis (Chronic)   Reflux symptoms are controlled on PPI. Patient denies red flag symptoms - no melena, weight loss, dysphagia.       Relevant Medications   dicyclomine  (BENTYL ) 10 MG capsule   omeprazole  (PRILOSEC) 40 MG capsule   Other Relevant Orders   CBC with Differential/Platelet   Hypothyroidism due to acquired atrophy of thyroid  (Chronic)   Supplemented.      Relevant Orders   TSH + free T4   Other Visit Diagnoses       Annual physical exam    -  Primary   consider Shingles vaccines. continue healthy diet and exercise   Relevant Orders   CBC with Differential/Platelet   Comprehensive metabolic panel with GFR   Lipid panel   TSH + free T4     Encounter for screening mammogram for breast cancer       Relevant Orders   MM 3D SCREENING MAMMOGRAM BILATERAL BREAST     Colon cancer screening       she is due for 10 yr colonoscopy but she declines will order Cologuard instead   Relevant Orders   Cologuard     Hypokalemia       Relevant Medications   potassium chloride  (KLOR-CON  M) 10 MEQ tablet     Gastroesophageal reflux disease without esophagitis       Relevant Medications   dicyclomine  (BENTYL ) 10 MG capsule   omeprazole  (PRILOSEC) 40 MG capsule       Return in about 6 months (around 08/26/2024) for HTN.    Sheron Dixons, MD [facility] [place]

## 2024-02-26 ENCOUNTER — Other Ambulatory Visit: Payer: Self-pay | Admitting: Internal Medicine

## 2024-02-26 ENCOUNTER — Encounter: Payer: Self-pay | Admitting: Internal Medicine

## 2024-02-26 DIAGNOSIS — E782 Mixed hyperlipidemia: Secondary | ICD-10-CM

## 2024-02-26 LAB — CBC WITH DIFFERENTIAL/PLATELET
Basophils Absolute: 0 10*3/uL (ref 0.0–0.2)
Basos: 1 %
EOS (ABSOLUTE): 0.1 10*3/uL (ref 0.0–0.4)
Eos: 2 %
Hematocrit: 36.1 % (ref 34.0–46.6)
Hemoglobin: 12.1 g/dL (ref 11.1–15.9)
Immature Grans (Abs): 0 10*3/uL (ref 0.0–0.1)
Immature Granulocytes: 0 %
Lymphocytes Absolute: 1.4 10*3/uL (ref 0.7–3.1)
Lymphs: 19 %
MCH: 32.7 pg (ref 26.6–33.0)
MCHC: 33.5 g/dL (ref 31.5–35.7)
MCV: 98 fL — ABNORMAL HIGH (ref 79–97)
Monocytes Absolute: 0.6 10*3/uL (ref 0.1–0.9)
Monocytes: 8 %
Neutrophils Absolute: 5.2 10*3/uL (ref 1.4–7.0)
Neutrophils: 70 %
Platelets: 258 10*3/uL (ref 150–450)
RBC: 3.7 x10E6/uL — ABNORMAL LOW (ref 3.77–5.28)
RDW: 13.2 % (ref 11.7–15.4)
WBC: 7.4 10*3/uL (ref 3.4–10.8)

## 2024-02-26 LAB — COMPREHENSIVE METABOLIC PANEL WITH GFR
ALT: 23 IU/L (ref 0–32)
AST: 25 IU/L (ref 0–40)
Albumin: 4.6 g/dL (ref 3.9–4.9)
Alkaline Phosphatase: 112 IU/L (ref 44–121)
BUN/Creatinine Ratio: 25 (ref 12–28)
BUN: 19 mg/dL (ref 8–27)
Bilirubin Total: 0.5 mg/dL (ref 0.0–1.2)
CO2: 26 mmol/L (ref 20–29)
Calcium: 9.5 mg/dL (ref 8.7–10.3)
Chloride: 101 mmol/L (ref 96–106)
Creatinine, Ser: 0.77 mg/dL (ref 0.57–1.00)
Globulin, Total: 2.3 g/dL (ref 1.5–4.5)
Glucose: 112 mg/dL — ABNORMAL HIGH (ref 70–99)
Potassium: 3.1 mmol/L — ABNORMAL LOW (ref 3.5–5.2)
Sodium: 143 mmol/L (ref 134–144)
Total Protein: 6.9 g/dL (ref 6.0–8.5)
eGFR: 87 mL/min/{1.73_m2} (ref >59)

## 2024-02-26 LAB — LIPID PANEL
Chol/HDL Ratio: 4.5 ratio — ABNORMAL HIGH (ref 0.0–4.4)
Cholesterol, Total: 190 mg/dL (ref 100–199)
HDL: 42 mg/dL (ref >39)
LDL Chol Calc (NIH): 115 mg/dL — ABNORMAL HIGH (ref 0–99)
Triglycerides: 190 mg/dL — ABNORMAL HIGH (ref 0–149)
VLDL Cholesterol Cal: 33 mg/dL (ref 5–40)

## 2024-02-26 LAB — TSH+FREE T4
Free T4: 1.27 ng/dL (ref 0.82–1.77)
TSH: 6.7 u[IU]/mL — ABNORMAL HIGH (ref 0.450–4.500)

## 2024-02-26 MED ORDER — ROSUVASTATIN CALCIUM 10 MG PO TABS
10.0000 mg | ORAL_TABLET | Freq: Every day | ORAL | 1 refills | Status: DC
Start: 2024-02-26 — End: 2024-08-26

## 2024-03-06 ENCOUNTER — Other Ambulatory Visit: Payer: Self-pay | Admitting: Internal Medicine

## 2024-03-06 DIAGNOSIS — E782 Mixed hyperlipidemia: Secondary | ICD-10-CM

## 2024-03-07 NOTE — Telephone Encounter (Signed)
 Requested Prescriptions  Refused Prescriptions Disp Refills   rosuvastatin  (CRESTOR ) 5 MG tablet [Pharmacy Med Name: ROSUVASTATIN  5MG  TABLETS] 90 tablet 3    Sig: TAKE 1 TABLET(5 MG) BY MOUTH DAILY     There is no refill protocol information for this order

## 2024-03-11 LAB — COLOGUARD: COLOGUARD: NEGATIVE

## 2024-03-12 ENCOUNTER — Ambulatory Visit: Payer: Self-pay | Admitting: Internal Medicine

## 2024-08-18 ENCOUNTER — Ambulatory Visit
Admission: RE | Admit: 2024-08-18 | Discharge: 2024-08-18 | Disposition: A | Discharge status: Home / Self Care | Source: Ambulatory Visit | Attending: Internal Medicine | Admitting: Internal Medicine

## 2024-08-18 DIAGNOSIS — Z1231 Encounter for screening mammogram for malignant neoplasm of breast: Secondary | ICD-10-CM | POA: Insufficient documentation

## 2024-08-26 ENCOUNTER — Ambulatory Visit: Admitting: Internal Medicine

## 2024-08-26 ENCOUNTER — Encounter: Payer: Self-pay | Admitting: Internal Medicine

## 2024-08-26 VITALS — BP 108/60 | HR 88 | Ht 62.0 in | Wt 138.0 lb

## 2024-08-26 DIAGNOSIS — I10 Essential (primary) hypertension: Secondary | ICD-10-CM

## 2024-08-26 DIAGNOSIS — E782 Mixed hyperlipidemia: Secondary | ICD-10-CM | POA: Diagnosis not present

## 2024-08-26 DIAGNOSIS — Z23 Encounter for immunization: Secondary | ICD-10-CM | POA: Diagnosis not present

## 2024-08-26 DIAGNOSIS — R739 Hyperglycemia, unspecified: Secondary | ICD-10-CM

## 2024-08-26 DIAGNOSIS — Z1211 Encounter for screening for malignant neoplasm of colon: Secondary | ICD-10-CM

## 2024-08-26 DIAGNOSIS — E034 Atrophy of thyroid (acquired): Secondary | ICD-10-CM | POA: Diagnosis not present

## 2024-08-26 DIAGNOSIS — M25531 Pain in right wrist: Secondary | ICD-10-CM

## 2024-08-26 MED ORDER — LEVOTHYROXINE SODIUM 50 MCG PO TABS: ORAL_TABLET | ORAL | 5 refills | Status: AC

## 2024-08-26 MED ORDER — ROSUVASTATIN CALCIUM 10 MG PO TABS: 10.0000 mg | ORAL_TABLET | Freq: Every day | ORAL | 5 refills | Status: AC

## 2024-08-26 NOTE — Assessment & Plan Note (Signed)
 Now on Crestor  10 mg without side effects Will recheck lipids and CMP

## 2024-08-26 NOTE — Patient Instructions (Addendum)
 Complex Regional pain syndrome possible

## 2024-08-26 NOTE — Progress Notes (Signed)
 Date:  08/26/2024   Name:  Darlene Newman   DOB:  19-May-1961   MRN:  [id number]   Chief Complaint: Hypertension and Hyperlipidemia  Hypertension This is a chronic problem. The problem is controlled. Pertinent negatives include no chest pain, headaches, palpitations or shortness of breath. Past treatments include angiotensin blockers and diuretics. The current treatment provides significant improvement.  Hyperlipidemia This is a chronic problem. Recent lipid tests were reviewed and are high. Pertinent negatives include no chest pain, myalgias or shortness of breath. Current antihyperlipidemic treatment includes statins (dose increased since last visit).  Wrist pain - she had surgery in August for tenosynovitis.  Since then she continues to have pain and swelling and Raynaud's symptoms.  She has not been able to return to work.  She continues in PTx and takes nsaids as needed.   Review of Systems  Constitutional:  Negative for chills, fatigue and unexpected weight change.  HENT:  Negative for trouble swallowing.   Eyes:  Negative for visual disturbance.  Respiratory:  Negative for cough, chest tightness, shortness of breath and wheezing.   Cardiovascular:  Negative for chest pain, palpitations and leg swelling.  Gastrointestinal:  Negative for abdominal pain, constipation and diarrhea.  Musculoskeletal:  Positive for arthralgias (right hand pain and Raynaud's) and joint swelling. Negative for myalgias.  Neurological:  Negative for dizziness, weakness, light-headedness and headaches.  Psychiatric/Behavioral:  Negative for dysphoric mood and sleep disturbance. The patient is not nervous/anxious.      Lab Results  Component Value Date   NA 143 02/25/2024   K 3.1 (L) 02/25/2024   CO2 26 02/25/2024   GLUCOSE 112 (H) 02/25/2024   BUN 19 02/25/2024   CREATININE 0.77 02/25/2024   CALCIUM  9.5 02/25/2024   EGFR 87 02/25/2024   GFRNONAA 79 01/22/2020   Lab Results  Component Value  Date   CHOL 190 02/25/2024   HDL 42 02/25/2024   LDLCALC 115 (H) 02/25/2024   TRIG 190 (H) 02/25/2024   CHOLHDL 4.5 (H) 02/25/2024   Lab Results  Component Value Date   TSH 6.700 (H) 02/25/2024   No results found for: HGBA1C Lab Results  Component Value Date   WBC 7.4 02/25/2024   HGB 12.1 02/25/2024   HCT 36.1 02/25/2024   MCV 98 (H) 02/25/2024   PLT 258 02/25/2024   Lab Results  Component Value Date   ALT 23 02/25/2024   AST 25 02/25/2024   ALKPHOS 112 02/25/2024   BILITOT 0.5 02/25/2024   No results found for: MARIEN BOLLS, VD25OH   Patient Active Problem List   Diagnosis Date Noted   Radial styloid tenosynovitis (de quervain) 01/08/2024   Unilateral primary osteoarthritis of first carpometacarpal joint, right hand 01/08/2024   Acquired trigger finger of right ring finger 01/08/2024   Palmar fascial fibromatosis (dupuytren) 08/22/2023   H/O total vaginal hysterectomy 02/01/2022   Chronic left-sided low back pain with left-sided sciatica 08/03/2020   Atherosclerosis of aorta 01/22/2020   Degenerative cervical disc 06/12/2017   Hypothyroidism due to acquired atrophy of thyroid  11/24/2016   Impingement syndrome of right shoulder 05/20/2015   Mixed hyperlipidemia 02/17/2015   Benign hypertension 02/17/2015   Climacteric 02/17/2015   History of fundoplication 02/17/2015   Tobacco use disorder, mild, in sustained remission 02/17/2015   Dermatitis contusiformis 05/31/2012   GERD without esophagitis 05/24/2012   Multiple lung nodules on CT 08/31/2011    Allergies  Allergen Reactions   Ibuprofen     Reduced renal fxn  Amlodipine  Swelling    Past Surgical History:  Procedure Laterality Date   COLONOSCOPY  2015   COLONOSCOPY WITH ESOPHAGOGASTRODUODENOSCOPY (EGD)  2015   GASTRIC FUNDOPLICATION     HIATAL HERNIA REPAIR  2015   TOTAL VAGINAL HYSTERECTOMY  2012    Social History   Tobacco Use   Smoking status: Former    Current packs/day:  0.00    Average packs/day: 1.5 packs/day for 20.0 years (30.0 ttl pk-yrs)    Types: Cigarettes    Start date: 10/30/1986    Quit date: 10/30/2006    Years since quitting: 17.8   Smokeless tobacco: Never  Vaping Use   Vaping status: Never Used  Substance Use Topics   Alcohol use: No    Alcohol/week: 0.0 standard drinks of alcohol   Drug use: No     Medication list has been reviewed and updated.  Current Meds  Medication Sig   dicyclomine  (BENTYL ) 10 MG capsule Take 1 capsule (10 mg total) by mouth in the morning and at bedtime.   hydrochlorothiazide  (HYDRODIURIL ) 25 MG tablet Take 1 tablet (25 mg total) by mouth daily.   losartan  (COZAAR ) 100 MG tablet TAKE 1 TABLET(100 MG) BY MOUTH DAILY   omeprazole  (PRILOSEC) 40 MG capsule TAKE 1 CAPSULE(40 MG) BY MOUTH DAILY   PARoxetine  (PAXIL ) 20 MG tablet TAKE 1 TABLET(20 MG) BY MOUTH DAILY   potassium chloride  (KLOR-CON  M) 10 MEQ tablet Take 1 tablet (10 mEq total) by mouth daily.   [DISCONTINUED] levothyroxine  (SYNTHROID ) 50 MCG tablet TAKE 1 TABLET(50 MCG) BY MOUTH DAILY BEFORE BREAKFAST   [DISCONTINUED] rosuvastatin  (CRESTOR ) 10 MG tablet Take 1 tablet (10 mg total) by mouth daily.       08/26/2024    8:15 AM 02/25/2024    8:56 AM 01/29/2024    9:10 AM 01/16/2024    1:30 PM  GAD 7 : Generalized Anxiety Score  Nervous, Anxious, on Edge 0 0 0 0  Control/stop worrying 0 0 0 0  Worry too much - different things 0 0 0 0  Trouble relaxing 0 0 0 0  Restless 0 0 0 0  Easily annoyed or irritable 0 0 0 0  Afraid - awful might happen 0 0 0 0  Total GAD 7 Score 0 0 0 0  Anxiety Difficulty Not difficult at all Not difficult at all Not difficult at all Not difficult at all       08/26/2024    8:15 AM 02/25/2024    8:56 AM 01/29/2024    9:10 AM  Depression screen PHQ 2/9  Decreased Interest 0 1 2  Down, Depressed, Hopeless 0 0 0  PHQ - 2 Score 0 1 2  Altered sleeping 0 1 0  Tired, decreased energy 0 1 2  Change in appetite 0 0 0  Feeling  bad or failure about yourself  0 0 0  Trouble concentrating 0 0 0  Moving slowly or fidgety/restless 0 0 0  Suicidal thoughts 0 0 0  PHQ-9 Score 0 3 4  Difficult doing work/chores Not difficult at all Not difficult at all Not difficult at all    BP Readings from Last 3 Encounters:  08/26/24 108/60  02/25/24 122/70  01/29/24 110/70    Physical Exam Vitals and nursing note reviewed.  Constitutional:      General: She is not in acute distress.    Appearance: She is well-developed.  HENT:     Head: Normocephalic and atraumatic.  Cardiovascular:  Rate and Rhythm: Normal rate and regular rhythm.     Pulses: Normal pulses.     Heart sounds: No murmur heard. Pulmonary:     Effort: Pulmonary effort is normal. No respiratory distress.     Breath sounds: No wheezing or rhonchi.  Musculoskeletal:     Right wrist: Swelling and tenderness present. Decreased range of motion. Normal pulse.     Cervical back: Normal range of motion.     Right lower leg: No edema.     Left lower leg: No edema.  Lymphadenopathy:     Cervical: No cervical adenopathy.  Skin:    General: Skin is warm and dry.     Findings: No rash.  Neurological:     Mental Status: She is alert and oriented to person, place, and time.  Psychiatric:        Mood and Affect: Mood normal.        Behavior: Behavior normal.     Wt Readings from Last 3 Encounters:  08/26/24 138 lb (62.6 kg)  02/25/24 135 lb 8 oz (61.5 kg)  01/29/24 134 lb 2 oz (60.8 kg)    BP 108/60   Pulse 88   Ht 5' 2 (1.575 m)   Wt 138 lb (62.6 kg)   SpO2 98%   BMI 25.24 kg/m   Assessment and Plan:  Problem List Items Addressed This Visit       Unprioritized   Mixed hyperlipidemia (Chronic)   Now on Crestor  10 mg without side effects Will recheck lipids and CMP      Relevant Medications   rosuvastatin  (CRESTOR ) 10 MG tablet   Other Relevant Orders   Comprehensive metabolic panel with GFR   Lipid panel   Benign hypertension -  Primary (Chronic)   Well controlled blood pressure today. Current regimen is losartan  and hydrochlorothiazide . No medication side effects noted.        Relevant Medications   rosuvastatin  (CRESTOR ) 10 MG tablet   Hypothyroidism due to acquired atrophy of thyroid  (Chronic)   Relevant Medications   levothyroxine  (SYNTHROID ) 50 MCG tablet   Other Visit Diagnoses       Elevated serum glucose       Relevant Orders   Hemoglobin A1c     Encounter for immunization       Relevant Orders   Flu vaccine trivalent PF, 6mos and older(Flulaval,Afluria,Fluarix,Fluzone) (Completed)     Acute pain of right wrist       after wrist surgery - concern for possible CRPS continue NSAIDS, PTx       Return in about 6 months (around 02/24/2025) for CPX TOC  Dr. Lemon.    Leita HILARIO Adie, MD [facility] [place]

## 2024-08-26 NOTE — Assessment & Plan Note (Signed)
 Well controlled blood pressure today. Current regimen is losartan  and hydrochlorothiazide . No medication side effects noted.

## 2024-08-27 ENCOUNTER — Telehealth: Payer: Self-pay

## 2024-08-27 ENCOUNTER — Encounter: Payer: Self-pay | Admitting: Internal Medicine

## 2024-08-27 ENCOUNTER — Ambulatory Visit: Payer: Self-pay | Admitting: Internal Medicine

## 2024-08-27 ENCOUNTER — Other Ambulatory Visit: Payer: Self-pay | Admitting: Internal Medicine

## 2024-08-27 DIAGNOSIS — E782 Mixed hyperlipidemia: Secondary | ICD-10-CM

## 2024-08-27 LAB — COMPREHENSIVE METABOLIC PANEL WITH GFR
ALT: 19 IU/L (ref 0–32)
AST: 19 IU/L (ref 0–40)
Albumin: 4.4 g/dL (ref 3.9–4.9)
Alkaline Phosphatase: 96 IU/L (ref 49–135)
BUN/Creatinine Ratio: 22 (ref 12–28)
BUN: 20 mg/dL (ref 8–27)
Bilirubin Total: 0.6 mg/dL (ref 0.0–1.2)
CO2: 24 mmol/L (ref 20–29)
Calcium: 9.7 mg/dL (ref 8.7–10.3)
Chloride: 99 mmol/L (ref 96–106)
Creatinine, Ser: 0.9 mg/dL (ref 0.57–1.00)
Globulin, Total: 2.6 g/dL (ref 1.5–4.5)
Glucose: 112 mg/dL — ABNORMAL HIGH (ref 70–99)
Potassium: 3.1 mmol/L — ABNORMAL LOW (ref 3.5–5.2)
Sodium: 141 mmol/L (ref 134–144)
Total Protein: 7 g/dL (ref 6.0–8.5)
eGFR: 72 mL/min/1.73 (ref >59)

## 2024-08-27 LAB — HEMOGLOBIN A1C
Est. average glucose Bld gHb Est-mCnc: 114 mg/dL
Hgb A1c MFr Bld: 5.6 % (ref 4.8–5.6)

## 2024-08-27 LAB — LIPID PANEL
Chol/HDL Ratio: 4.4 ratio (ref 0.0–4.4)
Cholesterol, Total: 166 mg/dL (ref 100–199)
HDL: 38 mg/dL — ABNORMAL LOW (ref >39)
LDL Chol Calc (NIH): 92 mg/dL (ref 0–99)
Triglycerides: 213 mg/dL — ABNORMAL HIGH (ref 0–149)
VLDL Cholesterol Cal: 36 mg/dL (ref 5–40)

## 2024-08-27 NOTE — Telephone Encounter (Signed)
 Copied from CRM [id number]. Topic: Referral - Question >> Aug 26, 2024  3:33 PM Fonda T wrote: Reason for CRM: Patient states she received a message via mychart regarding a referral, and is unsure what the referral is for.  Per chart review, referral placed today at visit for a Colon cancer screening, sent to: [facility] [address], [address] [place] [address] [phone]  Patient reports, she has already completed a cologuard test, this past summer and results were normal.  Patient requesting a return call to discuss further, and to make sure results from cologuard were received.  Patient can be reached to discuss further at [phone].

## 2024-08-27 NOTE — Addendum Note (Signed)
 Addended by: CLAUDIS EUAL SAILOR on: 08/27/2024 10:26 AM   Modules accepted: Orders

## 2024-08-28 NOTE — Telephone Encounter (Signed)
 Duplicate request, too soon for refill.  Requested Prescriptions  Pending Prescriptions Disp Refills   rosuvastatin  (CRESTOR ) 10 MG tablet [Pharmacy Med Name: ROSUVASTATIN  10MG  TABLETS] 90 tablet     Sig: TAKE 1 TABLET(10 MG) BY MOUTH DAILY     Cardiovascular:  Antilipid - Statins 2 Failed - 08/28/2024  4:09 PM      Failed - Lipid Panel in normal range within the last 12 months    Cholesterol, Total  Date Value Ref Range Status  08/26/2024 166 100 - 199 mg/dL Final   LDL Chol Calc (NIH)  Date Value Ref Range Status  08/26/2024 92 0 - 99 mg/dL Final   HDL  Date Value Ref Range Status  08/26/2024 38 (L) >39 mg/dL Final   Triglycerides  Date Value Ref Range Status  08/26/2024 213 (H) 0 - 149 mg/dL Final         Passed - Cr in normal range and within 360 days    Creatinine, Ser  Date Value Ref Range Status  08/26/2024 0.90 0.57 - 1.00 mg/dL Final         Passed - Patient is not pregnant      Passed - Valid encounter within last 12 months    Recent Outpatient Visits           2 days ago Benign hypertension   [facility] at [facility], Leita DEL, MD   6 months ago Annual physical exam   [facility] at [facility], Leita DEL, MD   7 months ago Benign hypertension   [facility] at [facility], Leita DEL, MD   7 months ago Vasovagal syncope   [facility] at [facility], Leita DEL, MD

## 2024-08-28 NOTE — Telephone Encounter (Signed)
Pt was able to get medication.

## 2025-02-24 ENCOUNTER — Encounter: Admitting: Student
